# Patient Record
Sex: Female | Born: 1990 | Race: Black or African American | Hispanic: No | Marital: Single | Smoking: Never smoker
Health system: Southern US, Community
[De-identification: ages and names within clinical notes are randomized; demographics above are authoritative.]

## PROBLEM LIST (undated history)

## (undated) ENCOUNTER — Inpatient Hospital Stay (HOSPITAL_COMMUNITY): Payer: Self-pay

## (undated) DIAGNOSIS — Z789 Other specified health status: Secondary | ICD-10-CM

## (undated) HISTORY — PX: NO PAST SURGERIES: SHX2092

---

## 2012-10-08 ENCOUNTER — Inpatient Hospital Stay (HOSPITAL_COMMUNITY)
Admission: AD | Admit: 2012-10-08 | Discharge: 2012-10-08 | Disposition: A | Discharge status: Home / Self Care | Payer: BC Managed Care – PPO | Source: Ambulatory Visit | Attending: Obstetrics & Gynecology | Admitting: Obstetrics & Gynecology

## 2012-10-08 ENCOUNTER — Encounter (HOSPITAL_COMMUNITY): Payer: Self-pay | Admitting: Medical

## 2012-10-08 DIAGNOSIS — Z3201 Encounter for pregnancy test, result positive: Secondary | ICD-10-CM

## 2012-10-08 LAB — POCT PREGNANCY, URINE: Preg Test, Ur: POSITIVE — AB

## 2012-10-08 NOTE — MAU Provider Note (Signed)
Ms. Annette Welch is a 22 y.o. G1P0 at [redacted]w[redacted]d who presents to MAU today for pregnancy confirmation. The patient has had +HPT x 4. She denies abdominal pain, N/V, fever or vaginal bleeding.   BP 126/76  Pulse 80  Temp(Src) 97.9 F (36.6 C) (Oral)  Resp 16  Ht 5' 3.75" (1.619 m)  Wt 169 lb (76.658 kg)  BMI 29.25 kg/m2  LMP 09/02/2012 GENERAL: Well-developed, well-nourished female in no acute distress.  HEENT: Normocephalic, atraumatic.   LUNGS: Effort normal HEART: Regular rate  SKIN: Warm, dry and without erythema PSYCH: Normal mood and affect  Results for orders placed during the hospital encounter of 10/08/12 (from the past 24 hour(s))  POCT PREGNANCY, URINE     Status: Abnormal   Collection Time    10/08/12  1:26 PM      Result Value Range   Preg Test, Ur POSITIVE (*) NEGATIVE   A: Positive pregnancy test  P: Discharge home First trimester warning signs reviewed Patient has an appointment scheduled to start prenatal care with [facility].  Patient given pregnancy confirmation letter given Patient may return to MAU as needed  Freddi Starr, PA-C  10/08/2012 2:09 PM

## 2012-10-08 NOTE — MAU Note (Signed)
Pt states LMP-09/02/2012, denies pain or bleeding, had 4 positive home upts. Here for verification.

## 2012-11-05 ENCOUNTER — Ambulatory Visit (INDEPENDENT_AMBULATORY_CARE_PROVIDER_SITE_OTHER): Payer: Medicaid Other | Admitting: Obstetrics

## 2012-11-05 ENCOUNTER — Encounter: Payer: Self-pay | Admitting: Obstetrics

## 2012-11-05 VITALS — BP 121/75 | Temp 99.0°F | Wt 169.6 lb

## 2012-11-05 DIAGNOSIS — Z3401 Encounter for supervision of normal first pregnancy, first trimester: Secondary | ICD-10-CM

## 2012-11-05 DIAGNOSIS — Z3201 Encounter for pregnancy test, result positive: Secondary | ICD-10-CM

## 2012-11-05 LAB — OB RESULTS CONSOLE GC/CHLAMYDIA
CHLAMYDIA, DNA PROBE: NEGATIVE
Gonorrhea: NEGATIVE

## 2012-11-05 LAB — POCT URINALYSIS DIPSTICK
Blood, UA: NEGATIVE
Glucose, UA: NEGATIVE
Nitrite, UA: NEGATIVE
Urobilinogen, UA: NEGATIVE

## 2012-11-05 NOTE — Progress Notes (Signed)
Pulse- 62 . Subjective:    Annette Welch is being seen today for her first obstetrical visit.  This is not a planned pregnancy. She is at [redacted]w[redacted]d gestation. Her obstetrical history is significant for none. Relationship with FOB: significant other, living together. Patient does intend to breast feed. Pregnancy history fully reviewed.  Menstrual History: OB History   Grav Para Term Preterm Abortions TAB SAB Ect Mult Living   1               Menarche age: 22 Patient's last menstrual period was 09/02/2012.    The following portions of the patient's history were reviewed and updated as appropriate: allergies, current medications, past family history, past medical history, past social history, past surgical history and problem list.  Review of Systems Pertinent items are noted in HPI.    Objective:    General appearance: alert and no distress Abdomen: normal findings: soft, non-tender Pelvic: cervix normal in appearance, external genitalia normal, no adnexal masses or tenderness, no cervical motion tenderness, uterus normal size, shape, and consistency and vagina normal without discharge Extremities: extremities normal, atraumatic, no cyanosis or edema    Assessment:    Pregnancy at [redacted]w[redacted]d weeks    Plan:    Initial labs drawn. Prenatal vitamins.  Counseling provided regarding continued use of seat belts, cessation of alcohol consumption, smoking or use of illicit drugs; infection precautions i.e., influenza/TDAP immunizations, toxoplasmosis,CMV, parvovirus, listeria and varicella; workplace safety, exercise during pregnancy; routine dental care, safe medications, sexual activity, hot tubs, saunas, pools, travel, caffeine use, fish and methlymercury, potential toxins, hair treatments, varicose veins Weight gain recommendations reviewed: underweight/BMI< 18.5--> gain 28 - 40 lbs; normal weight/BMI 18.5 - 24.9--> gain 25 - 35 lbs; overweight/BMI 25 - 29.9--> gain 15 - 25 lbs; obese/BMI  >30->gain  11 - 20 lbs Problem list reviewed and updated. AFP3 discussed: requested. Role of ultrasound in pregnancy discussed; fetal survey: requested. Amniocentesis discussed: not indicated. Follow up in 2 weeks. 50% of 20 min visit spent on counseling and coordination of care.

## 2012-11-05 NOTE — Addendum Note (Signed)
Addended by: George Hugh on: 11/05/2012 01:46 PM   Modules accepted: Orders

## 2012-11-06 LAB — OBSTETRIC PANEL
Eosinophils Absolute: 0.1 10*3/uL (ref 0.0–0.7)
Hemoglobin: 12.6 g/dL (ref 12.0–15.0)
Hepatitis B Surface Ag: NEGATIVE
Lymphocytes Relative: 34 % (ref 12–46)
Lymphs Abs: 1.9 10*3/uL (ref 0.7–4.0)
MCH: 29.4 pg (ref 26.0–34.0)
Monocytes Relative: 10 % (ref 3–12)
Neutro Abs: 3 10*3/uL (ref 1.7–7.7)
Neutrophils Relative %: 54 % (ref 43–77)
Platelets: 271 10*3/uL (ref 150–400)
RBC: 4.29 MIL/uL (ref 3.87–5.11)
Rh Type: POSITIVE
Rubella: 3.48 Index — ABNORMAL HIGH (ref <0.90)
WBC: 5.6 10*3/uL (ref 4.0–10.5)

## 2012-11-06 LAB — WET PREP BY MOLECULAR PROBE: Candida species: NEGATIVE

## 2012-11-06 LAB — VARICELLA ZOSTER ANTIBODY, IGG: Varicella IgG: 2034 Index — ABNORMAL HIGH (ref <135.00)

## 2012-11-06 LAB — VITAMIN D 25 HYDROXY (VIT D DEFICIENCY, FRACTURES): Vit D, 25-Hydroxy: 12 ng/mL — ABNORMAL LOW (ref 30–89)

## 2012-11-07 LAB — CULTURE, OB URINE: Organism ID, Bacteria: NO GROWTH

## 2012-11-07 LAB — HEMOGLOBINOPATHY EVALUATION
Hemoglobin Other: 0 %
Hgb A: 96.8 % (ref 96.8–97.8)
Hgb S Quant: 0 %

## 2012-11-08 ENCOUNTER — Other Ambulatory Visit: Payer: Self-pay | Admitting: *Deleted

## 2012-11-08 DIAGNOSIS — B9689 Other specified bacterial agents as the cause of diseases classified elsewhere: Secondary | ICD-10-CM

## 2012-11-08 MED ORDER — METRONIDAZOLE 500 MG PO TABS: 500.0000 mg | ORAL_TABLET | Freq: Two times a day (BID) | ORAL | Status: DC

## 2012-11-20 ENCOUNTER — Other Ambulatory Visit: Payer: Self-pay | Admitting: *Deleted

## 2012-11-20 MED ORDER — FLUCONAZOLE 150 MG PO TABS: 150.0000 mg | ORAL_TABLET | Freq: Once | ORAL | Status: DC

## 2012-11-20 NOTE — Progress Notes (Signed)
Patient states that she recently finished her antibiotics and now has vaginal itching.  Diflucan 150mg  PO sent to pharmacy.  Patient advised if symptoms do not improve to call office and make an apointment.

## 2012-11-22 ENCOUNTER — Ambulatory Visit (INDEPENDENT_AMBULATORY_CARE_PROVIDER_SITE_OTHER): Payer: Medicaid Other | Admitting: Obstetrics & Gynecology

## 2012-11-22 VITALS — BP 117/74 | Temp 97.3°F | Wt 174.0 lb

## 2012-11-22 DIAGNOSIS — Z3401 Encounter for supervision of normal first pregnancy, first trimester: Secondary | ICD-10-CM

## 2012-11-22 DIAGNOSIS — Z3400 Encounter for supervision of normal first pregnancy, unspecified trimester: Secondary | ICD-10-CM | POA: Insufficient documentation

## 2012-11-22 LAB — POCT URINALYSIS DIPSTICK
Glucose, UA: NEGATIVE
Ketones, UA: NEGATIVE
Leukocytes, UA: NEGATIVE
Protein, UA: NEGATIVE
Urobilinogen, UA: NEGATIVE

## 2012-11-22 NOTE — Progress Notes (Signed)
Pt states pain and pressure early n morning before bowels.

## 2012-11-26 ENCOUNTER — Encounter: Payer: Self-pay | Admitting: Obstetrics & Gynecology

## 2012-12-20 ENCOUNTER — Ambulatory Visit (INDEPENDENT_AMBULATORY_CARE_PROVIDER_SITE_OTHER): Payer: Medicaid Other | Admitting: Obstetrics & Gynecology

## 2012-12-20 ENCOUNTER — Encounter: Payer: Self-pay | Admitting: Obstetrics & Gynecology

## 2012-12-20 VITALS — BP 115/64 | Temp 98.1°F | Wt 179.0 lb

## 2012-12-20 DIAGNOSIS — Z3402 Encounter for supervision of normal first pregnancy, second trimester: Secondary | ICD-10-CM

## 2012-12-20 DIAGNOSIS — Z3400 Encounter for supervision of normal first pregnancy, unspecified trimester: Secondary | ICD-10-CM

## 2012-12-20 LAB — POCT URINALYSIS DIPSTICK
Bilirubin, UA: NEGATIVE
Leukocytes, UA: NEGATIVE
Nitrite, UA: NEGATIVE
pH, UA: 7

## 2012-12-20 NOTE — Progress Notes (Signed)
Pulse- 75 Offered quad screen/CF mutation testing.

## 2012-12-20 NOTE — Patient Instructions (Signed)
Alpha-Fetoprotein Alpha-Fetoprotein (AFP) is a protein made by the fetal liver. It is normally elevated in the newborn and the mother. In the infant, the value falls to adult values (normally less than 20 ng/ml (nanograms per milliliter) by one year of age. This protein is found in a number of abnormal tumors and conditions. It can be used for a screening test when this is appropriate. PREPARATION FOR TEST No preparation or fasting is required. ELEVATIONS OF AFP ARE CAUSED BY:  Primary hepatocellular carcinoma (cancer of the liver) and the level correlates with tumor size.  A screening test for embryonic teratocarcinomas, hepatoblastomas (tumor of the liver).  Rarely hepatic metastases (cancer spread) from the GI tract.  Some cholangiocarcinomas cause elevations greater than 400 ng/ml.  Rapidly progressing hepatitis can produce levels of AFP in excess of 1000ng/ml.  Lesser levels of hepatitis (inflammation of the liver) can produce levels of 100 to 400 ng/ml. NORMAL FINDINGS   Adult: Less than 40 ng/mL or Less than 40 mg/L (SI units).  Child younger than 1 year: Less than 30ng/mL. Ranges are stratified by weeks of gestation and vary among laboratories. Ranges for normal findings may vary among different laboratories and hospitals. You should always check with your doctor after having lab work or other tests done to discuss the meaning of your test results and whether your values are considered within normal limits. MEANING OF TEST  Your caregiver will go over the test results with you and discuss the importance and meaning of your results, as well as treatment options and the need for additional tests if necessary. OBTAINING THE TEST RESULTS  It is your responsibility to obtain your test results. Ask the lab or department performing the test when and how you will get your results. Document Released: 03/31/2004 Document Revised: 05/30/2011 Document Reviewed: 02/10/2008 [employer] Patient  Information 2014 [employer]. CF Gene Mutation Testing This is a test used to detect cystic fibrosis (CF) genetic mutations to establish CF carrier status or to establish the diagnosis of CF in an individual. The CF gene mutation test identifies mutations in the CFTR gene on chromosome 7. Each cell in the human body (except sperm and eggs) has 46 chromosomes (23 inherited from the mother and 23 from the father). Genes on these chromosomes form the body's blueprint for producing proteins that control body functions. Cystic fibrosis is caused by a mutation in a pair of genes located on chromosomes 7. Both copies of this gene must be abnormal to cause CF. If only one copy of the gene pair is mutated, the patient will be a carrier. Carriers are not ill, they do not have any symptoms, but they can pass their abnormal CF gene copy on to their children.  When a newborn infant has meconium ileus (no stools in the first 24 to 48 hours of life) or when a person has symptoms of CF (salty sweat, persistent respiratory infections, wheezing, persistent diarrhea, foul-smelling greasy stools, malnutrition, and vitamin deficiency); if a person has a positive sweat chloride or IRT test or a close relative who has been diagnosed with CF; when a patient is undergoing genetic counseling and wants to find out if they are a CF carrier; or for prenatal diagnosis. PREPARATION FOR TEST A blood sample drawn from an infant's heel; a spot of blood that is put onto filter paper; or a blood sample is drawn from a vein in the arm. NORMAL FINDINGS No genetic mutation. Ranges for normal findings may vary among different laboratories   and hospitals. You should always check with your doctor after having lab work or other tests done to discuss the meaning of your test results and whether your values are considered within normal limits. MEANING OF TEST Your caregiver will go over the test results with you and discuss the importance and  meaning of your results, as well as treatment options and the need for additional tests if necessary. OBTAINING THE TEST RESULTS It is your responsibility to obtain your test results. Ask the lab or department performing the test when and how you will get your results. Document Released: 03/31/2004 Document Revised: 05/30/2011 Document Reviewed: 02/13/2008 [employer] Patient Information 2014 [employer].  

## 2012-12-30 ENCOUNTER — Encounter (HOSPITAL_COMMUNITY): Payer: Self-pay | Admitting: *Deleted

## 2012-12-30 ENCOUNTER — Inpatient Hospital Stay (HOSPITAL_COMMUNITY)
Admission: AD | Admit: 2012-12-30 | Discharge: 2012-12-30 | Disposition: A | Discharge status: Home / Self Care | Payer: Medicaid Other | Source: Ambulatory Visit | Attending: Obstetrics & Gynecology | Admitting: Obstetrics & Gynecology

## 2012-12-30 DIAGNOSIS — N899 Noninflammatory disorder of vagina, unspecified: Secondary | ICD-10-CM

## 2012-12-30 DIAGNOSIS — B373 Candidiasis of vulva and vagina: Secondary | ICD-10-CM

## 2012-12-30 DIAGNOSIS — B3731 Acute candidiasis of vulva and vagina: Secondary | ICD-10-CM | POA: Insufficient documentation

## 2012-12-30 DIAGNOSIS — Z3401 Encounter for supervision of normal first pregnancy, first trimester: Secondary | ICD-10-CM

## 2012-12-30 DIAGNOSIS — O2390 Unspecified genitourinary tract infection in pregnancy, unspecified trimester: Secondary | ICD-10-CM | POA: Insufficient documentation

## 2012-12-30 DIAGNOSIS — O99891 Other specified diseases and conditions complicating pregnancy: Secondary | ICD-10-CM

## 2012-12-30 DIAGNOSIS — N949 Unspecified condition associated with female genital organs and menstrual cycle: Secondary | ICD-10-CM | POA: Insufficient documentation

## 2012-12-30 HISTORY — DX: Other specified health status: Z78.9

## 2012-12-30 LAB — WET PREP, GENITAL
Clue Cells Wet Prep HPF POC: NONE SEEN
Trich, Wet Prep: NONE SEEN

## 2012-12-30 MED ORDER — TERCONAZOLE 0.8 % VA CREA: 1.0000 | TOPICAL_CREAM | Freq: Every day | VAGINAL | Status: DC

## 2012-12-30 NOTE — MAU Note (Signed)
Pt. Here for vaginal irritation and itching for one week. Also, noticed swelling as well. Does have a small amount of white discharge that has no odor. Has not been wearing a panty liner. Denies any bleeding. Has not changed any soaps or detergents recently. No new sexual partners.

## 2012-12-30 NOTE — MAU Note (Signed)
Patient states she has had a vaginal irritation with itching for about one week. Slight discharge with no color or odor.

## 2012-12-30 NOTE — MAU Provider Note (Signed)
  History     CSN: [id number]  Arrival date and time: 12/30/12 1421   First Provider Initiated Contact with Patient 12/30/12 1521      Chief Complaint  Patient presents with  . Vaginal Discharge   HPI Annette Welch is a 22 y.o. G1P0 at [redacted]w[redacted]d. She presents with c/o vaginal/vulvar irritation/ithing x 1 wk. She has increased discharge, clear to white, no odor.No bleeding or leaking. No cramping or contractions.  OB History   Grav Para Term Preterm Abortions TAB SAB Ect Mult Living   1               Past Medical History  Diagnosis Date  . Medical history non-contributory     Past Surgical History  Procedure Laterality Date  . No past surgeries      Family History  Problem Relation Age of Onset  . Heart disease Mother     History  Substance Use Topics  . Smoking status: Never Smoker   . Smokeless tobacco: Never Used  . Alcohol Use: No    Allergies:  Allergies  Allergen Reactions  . Penicillins Hives    Prescriptions prior to admission  Medication Sig Dispense Refill  . Cholecalciferol (D3-1000) 1000 UNITS capsule Take 1,000 Units by mouth daily.      . Prenatal Vit-Fe Fumarate-FA (PRENATAL MULTIVITAMIN) TABS tablet Take 1 tablet by mouth daily at 12 noon.        Review of Systems  Constitutional: Negative for fever and chills.  Gastrointestinal: Negative for nausea, vomiting, diarrhea and constipation.  Genitourinary: Negative for dysuria, urgency and frequency.   Physical Exam   Pulse 83, temperature 99.2 F (37.3 C), temperature source Oral, resp. rate 16, height 5' 3.5" (1.613 m), weight 178 lb (80.74 kg), last menstrual period 09/02/2012, SpO2 100.00%.  Physical Exam  Constitutional: She is oriented to person, place, and time. She appears well-developed and well-nourished.  GI: Soft. She exhibits no distension. There is no tenderness.  +FHT's  Genitourinary:  Pelvic exam: Ext gen- erythema, 2 linear abrasions posterior introitus Vagina- mod  amt thick white smooth discharge Cx- closed Ut- gravid Adn-non tender  Musculoskeletal: Normal range of motion.  Neurological: She is alert and oriented to person, place, and time.  Skin: Skin is warm and dry.  Psychiatric: She has a normal mood and affect. Her behavior is normal.    MAU Course  Procedures  MDM Results for orders placed during the hospital encounter of 12/30/12 (from the past 24 hour(s))  WET PREP, GENITAL     Status: Abnormal   Collection Time    12/30/12  3:20 PM      Result Value Range   Yeast Wet Prep HPF POC FEW (*) NONE SEEN   Trich, Wet Prep NONE SEEN  NONE SEEN   Clue Cells Wet Prep HPF POC NONE SEEN  NONE SEEN   WBC, Wet Prep HPF POC MODERATE (*) NONE SEEN     Assessment and Plan  ASSESSMENT:   [redacted] wks pregnant Vaginal yeast  PLAN:  Terazol Cream X 3 hs  Warm soaks prn Keep next pnv  Saul Fabiano M. 12/30/2012, 3:29 PM

## 2013-01-08 ENCOUNTER — Other Ambulatory Visit: Payer: Self-pay | Admitting: Obstetrics & Gynecology

## 2013-01-08 ENCOUNTER — Encounter: Payer: Self-pay | Admitting: Obstetrics & Gynecology

## 2013-01-08 ENCOUNTER — Ambulatory Visit (INDEPENDENT_AMBULATORY_CARE_PROVIDER_SITE_OTHER): Payer: Medicaid Other

## 2013-01-08 DIAGNOSIS — Z1389 Encounter for screening for other disorder: Secondary | ICD-10-CM

## 2013-01-08 DIAGNOSIS — O09899 Supervision of other high risk pregnancies, unspecified trimester: Secondary | ICD-10-CM

## 2013-01-08 LAB — US OB DETAIL + 14 WK

## 2013-01-17 ENCOUNTER — Ambulatory Visit (INDEPENDENT_AMBULATORY_CARE_PROVIDER_SITE_OTHER): Payer: Medicaid Other | Admitting: Obstetrics & Gynecology

## 2013-01-17 ENCOUNTER — Encounter: Payer: Self-pay | Admitting: Obstetrics & Gynecology

## 2013-01-17 VITALS — BP 113/74 | Temp 98.2°F | Wt 184.2 lb

## 2013-01-17 DIAGNOSIS — Z3400 Encounter for supervision of normal first pregnancy, unspecified trimester: Secondary | ICD-10-CM

## 2013-01-17 DIAGNOSIS — Z3402 Encounter for supervision of normal first pregnancy, second trimester: Secondary | ICD-10-CM

## 2013-01-17 LAB — POCT URINALYSIS DIPSTICK: pH, UA: 8.5

## 2013-01-17 NOTE — Patient Instructions (Signed)
Pregnancy - Second Trimester The second trimester is the period between 13 to 27 weeks of your pregnancy. It is important to follow your doctor's instructions. HOME CARE   Do not smoke.  Do not drink alcohol or use drugs.  Only take medicine as told by your doctor.  Take prenatal vitamins as told. The vitamin should contain 1 milligram of folic acid.  Exercise.  Eat healthy foods. Eat regular, well-balanced meals.  You can have sex (intercourse) if there are no other problems with the pregnancy.  Do not use hot tubs, steam rooms, or saunas.  Wear a seat belt while driving.  Avoid raw meat, uncooked cheese, and litter boxes and soil used by cats.  Visit your dentist. Cleanings are okay. GET HELP RIGHT AWAY IF:   You have a temperature by mouth above 102 F (38.9 C), not controlled by medicine.  Fluid is coming from your vagina.  Blood is coming from your vagina. Light spotting is common, especially after sex (intercourse).  You have a bad smelling fluid (discharge) coming from the vagina. The fluid changes from clear to white.  You still feel sick to your stomach (nauseous).  You throw up (vomit) blood.  You lose or gain more than 2 pounds (0.9 kilograms) of weight in a week, or as suggested by your doctor.  Your face, hands, feet, or legs get puffy (swell).  You get exposed to German measles and have never had them.  You get exposed to fifth disease or chickenpox.  You have belly (abdominal) pain.  You have a bad headache that will not go away.  You have watery poop (diarrhea), pain when you pee (urinate), or have shortness of breath.  You start to have problems seeing (blurry or double vision).  You fall, are in a car accident, or have any kind of trauma.  There is mental or physical violence at home.  You have any concerns or worries during your pregnancy. MAKE SURE YOU:   Understand these instructions.  Will watch your condition.  Will get help  right away if you are not doing well or get worse. Document Released: 06/01/2009 Document Revised: 05/30/2011 Document Reviewed: 06/01/2009 [employer] Patient Information 2014 [employer].  

## 2013-01-17 NOTE — Progress Notes (Signed)
HR - 66 Routine OB visit, pt reports low back pain.

## 2013-02-11 ENCOUNTER — Encounter: Payer: Self-pay | Admitting: Obstetrics & Gynecology

## 2013-02-11 ENCOUNTER — Encounter: Payer: Medicaid Other | Admitting: Obstetrics & Gynecology

## 2013-02-11 ENCOUNTER — Ambulatory Visit (INDEPENDENT_AMBULATORY_CARE_PROVIDER_SITE_OTHER): Payer: Medicaid Other | Admitting: Obstetrics & Gynecology

## 2013-02-11 VITALS — BP 125/66 | Temp 98.0°F | Wt 194.0 lb

## 2013-02-11 DIAGNOSIS — Z3400 Encounter for supervision of normal first pregnancy, unspecified trimester: Secondary | ICD-10-CM

## 2013-02-11 DIAGNOSIS — Z3402 Encounter for supervision of normal first pregnancy, second trimester: Secondary | ICD-10-CM

## 2013-02-11 LAB — POCT URINALYSIS DIPSTICK
Bilirubin, UA: NEGATIVE
Blood, UA: NEGATIVE
Nitrite, UA: NEGATIVE
Protein, UA: NEGATIVE
Urobilinogen, UA: NEGATIVE
pH, UA: 7

## 2013-02-11 NOTE — Patient Instructions (Signed)
Glucose Tolerance Test This is a test to see how your body processes carbohydrates. This test is often done to check patients for diabetes or the possibility of developing it. PREPARATION FOR TEST You should have nothing to eat or drink 12 hours before the test. You will be given a form of sugar (glucose) and then blood samples will be drawn from your vein to determine the level of sugar in your blood. Alternatively, blood may be drawn from your finger for testing. You should not smoke or exercise during the test. NORMAL FINDINGS  Fasting: 70-115 mg/dL  30 minutes: less than 200 mg/dL  1 hour: less than 200 mg/dL  2 hours: less than 140 mg/dL  3 hours: 70-115 mg/dL  4 hours: 70-115 mg/dL Ranges for normal findings may vary among different laboratories and hospitals. You should always check with your doctor after having lab work or other tests done to discuss the meaning of your test results and whether your values are considered within normal limits. MEANING OF TEST Your caregiver will go over the test results with you and discuss the importance and meaning of your results, as well as treatment options and the need for additional tests. OBTAINING THE TEST RESULTS It is your responsibility to obtain your test results. Ask the lab or department performing the test when and how you will get your results. Document Released: 03/30/2004 Document Revised: 05/30/2011 Document Reviewed: 02/16/2008 [employer] Patient Information 2014 [employer].  

## 2013-02-11 NOTE — Progress Notes (Signed)
Pulse- 84 Doing well.

## 2013-03-11 ENCOUNTER — Other Ambulatory Visit: Payer: Medicaid Other

## 2013-03-11 ENCOUNTER — Encounter: Payer: Medicaid Other | Admitting: Obstetrics & Gynecology

## 2013-03-11 ENCOUNTER — Ambulatory Visit (INDEPENDENT_AMBULATORY_CARE_PROVIDER_SITE_OTHER): Payer: Medicaid Other | Admitting: Advanced Practice Midwife

## 2013-03-11 VITALS — BP 107/68 | Temp 97.0°F | Wt 204.0 lb

## 2013-03-11 DIAGNOSIS — Z3402 Encounter for supervision of normal first pregnancy, second trimester: Secondary | ICD-10-CM

## 2013-03-11 DIAGNOSIS — Z3400 Encounter for supervision of normal first pregnancy, unspecified trimester: Secondary | ICD-10-CM

## 2013-03-11 LAB — CBC
HCT: 34 % — ABNORMAL LOW (ref 36.0–46.0)
Hemoglobin: 11.8 g/dL — ABNORMAL LOW (ref 12.0–15.0)
MCHC: 34.7 g/dL (ref 30.0–36.0)

## 2013-03-11 LAB — POCT URINALYSIS DIPSTICK
Bilirubin, UA: NEGATIVE
Blood, UA: NEGATIVE
Glucose, UA: NEGATIVE
Leukocytes, UA: NEGATIVE
Nitrite, UA: NEGATIVE

## 2013-03-11 NOTE — Progress Notes (Signed)
Subjective: Annette Welch is a 22 y.o. at 27 weeks by LMP  Patient denies vaginal leaking of fluid or bleeding, denies contractions.  Reports positive fetal movment.  Reports anxiety about delivery and BRF. Eating 3 large meals and 3 snacks daily.  Objective: Filed Vitals:   03/11/13 1017  BP: 107/68  Temp: 97 F (36.1 C)   140 FHR 28 Fundal Height Fetal Position unknown  Assessment: Patient Active Problem List   Diagnosis Date Noted  . Supervision of normal first pregnancy 11/22/2012    Plan: Patient to return to clinic in 2 weeks Reviewed BRF and plans for delivery. Reviewed warning signs in pregnancy. Patient to call with concerns PRN. Reviewed triage location. Reviewed diet in pregnancy.  20 min spent with patient greater than 80% spent in counseling and coordination of care.   Moon Budde Wilson Singer CNM

## 2013-03-11 NOTE — Progress Notes (Signed)
Pulse: 90

## 2013-03-12 LAB — RPR

## 2013-03-12 LAB — GLUCOSE TOLERANCE, 2 HOURS W/ 1HR: Glucose, 2 hour: 66 mg/dL — ABNORMAL LOW (ref 70–139)

## 2013-03-26 ENCOUNTER — Encounter: Payer: Self-pay | Admitting: Advanced Practice Midwife

## 2013-03-26 ENCOUNTER — Ambulatory Visit (INDEPENDENT_AMBULATORY_CARE_PROVIDER_SITE_OTHER): Payer: Medicaid Other | Admitting: Advanced Practice Midwife

## 2013-03-26 VITALS — BP 131/83 | Temp 98.0°F | Wt 209.0 lb

## 2013-03-26 DIAGNOSIS — Z3400 Encounter for supervision of normal first pregnancy, unspecified trimester: Secondary | ICD-10-CM

## 2013-03-26 DIAGNOSIS — Z3403 Encounter for supervision of normal first pregnancy, third trimester: Secondary | ICD-10-CM

## 2013-03-26 LAB — POCT URINALYSIS DIPSTICK
KETONES UA: NEGATIVE
Nitrite, UA: NEGATIVE
PH UA: 7
RBC UA: NEGATIVE
Spec Grav, UA: 1.015
Urobilinogen, UA: NEGATIVE

## 2013-03-26 NOTE — Progress Notes (Signed)
Subjective: Annette Welch is a 23 y.o. at 29 weeks by LMP  Patient denies vaginal leaking of fluid or bleeding, denies contractions.  Reports positive fetal movment.  Patient c/o bilateral hand and arm numbness and weakness, tingling sensation. Desires wrist splints. Denies repetitive motion.  Objective: Filed Vitals:   03/26/13 0958  BP: 131/83  Temp: 98 F (36.7 C)   140 FHR 32 Fundal Height Fetal Position unknown  Assessment: Patient Active Problem List   Diagnosis Date Noted  . Supervision of normal first pregnancy 11/22/2012    Plan: Patient to return to clinic in 2 weeks Plan GBS w/ sensitivity @ 36 weeks Wrist splint RX today for presumed carpel tunnel of pregnancy Reviewed warning signs in pregnancy. Patient to call with concerns PRN. Reviewed triage location.  20 min spent with patient greater than 80% spent in counseling and coordination of care.   Jahmari Esbenshade Wilson SingerWren CNM

## 2013-03-26 NOTE — Progress Notes (Signed)
Pulse: 92

## 2013-03-28 ENCOUNTER — Encounter: Payer: Self-pay | Admitting: Obstetrics & Gynecology

## 2013-04-09 ENCOUNTER — Ambulatory Visit (INDEPENDENT_AMBULATORY_CARE_PROVIDER_SITE_OTHER): Payer: Medicaid Other | Admitting: Advanced Practice Midwife

## 2013-04-09 ENCOUNTER — Encounter: Payer: Self-pay | Admitting: Advanced Practice Midwife

## 2013-04-09 VITALS — BP 128/75 | Temp 98.3°F | Wt 214.0 lb

## 2013-04-09 DIAGNOSIS — Z3400 Encounter for supervision of normal first pregnancy, unspecified trimester: Secondary | ICD-10-CM

## 2013-04-09 LAB — POCT URINALYSIS DIPSTICK
BILIRUBIN UA: NEGATIVE
GLUCOSE UA: NEGATIVE
KETONES UA: NEGATIVE
Leukocytes, UA: NEGATIVE
Nitrite, UA: NEGATIVE
PH UA: 7
RBC UA: NEGATIVE
SPEC GRAV UA: 1.015
Urobilinogen, UA: NEGATIVE

## 2013-04-09 NOTE — Progress Notes (Signed)
Subjective: Annette Welch is a 23 y.o. at 31 weeks by LMP  Patient denies vaginal leaking of fluid or bleeding, denies contractions.  Reports positive fetal movment.  Denies concerns today. [facility]   Objective: Filed Vitals:   04/09/13 1029  BP: 128/75  Temp: 98.3 F (36.8 C)   140 FHR 32 Fundal Height Fetal Position uncertain  Assessment: Patient Active Problem List   Diagnosis Date Noted  . Supervision of normal first pregnancy 11/22/2012    Plan: Patient to return to clinic in 2 weeks Discuss Pediatrician, Tdap and BCM [place]. Reviewed warning signs in pregnancy. Patient to call with concerns PRN. Reviewed triage location.  Tramane Gorum Wilson SingerWren CNM

## 2013-04-09 NOTE — Progress Notes (Signed)
Pulse 106 Pt has no concerns today.

## 2013-04-23 ENCOUNTER — Ambulatory Visit (INDEPENDENT_AMBULATORY_CARE_PROVIDER_SITE_OTHER): Payer: Medicaid Other | Admitting: Obstetrics

## 2013-04-23 ENCOUNTER — Encounter: Payer: Self-pay | Admitting: Obstetrics

## 2013-04-23 VITALS — BP 120/75 | Temp 98.5°F | Wt 222.0 lb

## 2013-04-23 DIAGNOSIS — Z3400 Encounter for supervision of normal first pregnancy, unspecified trimester: Secondary | ICD-10-CM

## 2013-04-23 LAB — POCT URINALYSIS DIPSTICK
BILIRUBIN UA: NEGATIVE
GLUCOSE UA: NEGATIVE
KETONES UA: NEGATIVE
Leukocytes, UA: NEGATIVE
Nitrite, UA: NEGATIVE
Protein, UA: NEGATIVE
SPEC GRAV UA: 1.01
UROBILINOGEN UA: NEGATIVE
pH, UA: 6

## 2013-04-23 NOTE — Progress Notes (Signed)
Pulse: 97  Patient states she is having some pelvic pressure for two days. Patient denies any concerns.

## 2013-05-07 ENCOUNTER — Encounter: Payer: Medicaid Other | Admitting: Advanced Practice Midwife

## 2013-05-10 ENCOUNTER — Ambulatory Visit (INDEPENDENT_AMBULATORY_CARE_PROVIDER_SITE_OTHER): Payer: Medicaid Other | Admitting: Advanced Practice Midwife

## 2013-05-10 VITALS — BP 136/76 | Temp 97.9°F | Wt 227.0 lb

## 2013-05-10 DIAGNOSIS — Z3400 Encounter for supervision of normal first pregnancy, unspecified trimester: Secondary | ICD-10-CM

## 2013-05-10 LAB — POCT URINALYSIS DIPSTICK
Bilirubin, UA: NEGATIVE
Blood, UA: NEGATIVE
GLUCOSE UA: NEGATIVE
KETONES UA: NEGATIVE
LEUKOCYTES UA: NEGATIVE
Nitrite, UA: NEGATIVE
PROTEIN UA: NEGATIVE
SPEC GRAV UA: 1.01
Urobilinogen, UA: NEGATIVE
pH, UA: 6

## 2013-05-10 LAB — OB RESULTS CONSOLE GBS: GBS: NEGATIVE

## 2013-05-10 NOTE — Progress Notes (Signed)
Pulse 98 Pt states that she is having increase in pelvic pressure.  Pt states that she is having irregular contractions.  Pt states that she has also passed her mucous plug with in the past week.  Pt states that she is having some discomfort with intercourse.

## 2013-05-10 NOTE — Progress Notes (Signed)
Subjective: Annette Welch is a 23 y.o. at 35 weeks by LMP  Patient denies vaginal leaking of fluid or bleeding, denies contractions.  Reports positive fetal movment.  Reviewed common discomforts of pregnancy and warning signs. Encouraged patient to find a pediatrician.  Objective: Filed Vitals:   05/10/13 0927  BP: 136/76  Temp: 97.9 F (36.6 C)   140 FHR 32 Fundal Height Fetal Position cephalic FT/Thick/High posterior  Assessment: Patient Active Problem List   Diagnosis Date Noted  . Supervision of normal first pregnancy 11/22/2012    Plan: Patient to return to clinic in 1 weeks Gave pediatrician handout.  Discussed and encouraged Tdap w/ patient and her parnter.  GBS today w/ sensitivity. Reviewed warning signs in pregnancy. Patient to call with concerns PRN. Reviewed triage location.  15 min spent with patient greater than 80% spent in counseling and coordination of care.   Dotsie Gillette Wilson SingerWren CNM

## 2013-05-13 LAB — CULTURE, STREPTOCOCCUS GRP B W/SUSCEPT

## 2013-05-14 ENCOUNTER — Encounter: Payer: Medicaid Other | Admitting: Advanced Practice Midwife

## 2013-05-21 ENCOUNTER — Ambulatory Visit (INDEPENDENT_AMBULATORY_CARE_PROVIDER_SITE_OTHER): Payer: Medicaid Other | Admitting: Advanced Practice Midwife

## 2013-05-21 VITALS — BP 138/81 | Temp 98.2°F | Wt 234.0 lb

## 2013-05-21 DIAGNOSIS — Z3400 Encounter for supervision of normal first pregnancy, unspecified trimester: Secondary | ICD-10-CM

## 2013-05-21 LAB — POCT URINALYSIS DIPSTICK
Bilirubin, UA: NEGATIVE
GLUCOSE UA: NEGATIVE
Ketones, UA: NEGATIVE
LEUKOCYTES UA: NEGATIVE
NITRITE UA: NEGATIVE
PH UA: 6
RBC UA: NEGATIVE
Spec Grav, UA: 1.02
UROBILINOGEN UA: NEGATIVE

## 2013-05-21 NOTE — Progress Notes (Signed)
Subjective: Rosaline Sharol HarnessSimmons is a 23 y.o. at 37 weeks by LMP  Patient denies vaginal leaking of fluid or bleeding, denies contractions.  Reports positive fetal movment.  Feeling more pressure. Ready for delivery. They have chosen a pediatrician.   Objective: Filed Vitals:   05/21/13 0921  BP: 138/81  Temp: 98.2 F (36.8 C)   140 FHR 37 Fundal Height Fetal Position cephalic, confirmed by BS US  Assessment: Patient Active Problem List   Diagnosis Date Noted  . Supervision of normal first pregnancy 11/22/2012    Plan: Patient to return to clinic in 1 weeks Reviewed signs of active labor Plans [facility] Has Pediatrician chosen. Reviewed warning signs in pregnancy. Patient to call with concerns PRN. Reviewed triage location.  15 min spent with patient greater than 80% spent in counseling and coordination of care.   Daryan Cagley Wilson SingerWren CNM

## 2013-05-21 NOTE — Progress Notes (Signed)
Pulse: 91  Patient states she is having lower abdominal and pelvic pain and pressure. Patient states she is having pain in her rectum. Patient denies any concerns.

## 2013-05-29 ENCOUNTER — Ambulatory Visit (INDEPENDENT_AMBULATORY_CARE_PROVIDER_SITE_OTHER): Payer: Medicaid Other | Admitting: Obstetrics & Gynecology

## 2013-05-29 VITALS — BP 131/86 | Temp 97.9°F | Wt 238.0 lb

## 2013-05-29 DIAGNOSIS — Z3400 Encounter for supervision of normal first pregnancy, unspecified trimester: Secondary | ICD-10-CM

## 2013-05-29 LAB — POCT URINALYSIS DIPSTICK
Bilirubin, UA: NEGATIVE
Glucose, UA: NEGATIVE
Leukocytes, UA: NEGATIVE
NITRITE UA: NEGATIVE
PROTEIN UA: NEGATIVE
RBC UA: NEGATIVE
SPEC GRAV UA: 1.01
UROBILINOGEN UA: NEGATIVE
pH, UA: 7

## 2013-05-29 NOTE — Progress Notes (Signed)
Pulse: 90 Patient states she is having lower abdominal and pelvic pressure. Patient states she has contractions everyday.

## 2013-06-04 ENCOUNTER — Ambulatory Visit (INDEPENDENT_AMBULATORY_CARE_PROVIDER_SITE_OTHER): Payer: Medicaid Other | Admitting: Obstetrics

## 2013-06-04 ENCOUNTER — Encounter: Payer: Self-pay | Admitting: Obstetrics

## 2013-06-04 ENCOUNTER — Encounter: Payer: Medicaid Other | Admitting: Advanced Practice Midwife

## 2013-06-04 VITALS — BP 135/81 | Temp 97.4°F | Wt 235.0 lb

## 2013-06-04 DIAGNOSIS — Z3400 Encounter for supervision of normal first pregnancy, unspecified trimester: Secondary | ICD-10-CM

## 2013-06-04 LAB — POCT URINALYSIS DIPSTICK
Bilirubin, UA: NEGATIVE
Blood, UA: NEGATIVE
Glucose, UA: NEGATIVE
KETONES UA: NEGATIVE
Leukocytes, UA: NEGATIVE
Nitrite, UA: NEGATIVE
PH UA: 6.5
SPEC GRAV UA: 1.015
Urobilinogen, UA: NEGATIVE

## 2013-06-04 NOTE — Progress Notes (Signed)
Pulse: 84

## 2013-06-09 ENCOUNTER — Inpatient Hospital Stay (HOSPITAL_COMMUNITY)
Admission: AD | Admit: 2013-06-09 | Discharge: 2013-06-09 | Disposition: A | Discharge status: Home / Self Care | Payer: Medicaid Other | Source: Ambulatory Visit | Attending: Obstetrics & Gynecology | Admitting: Obstetrics & Gynecology

## 2013-06-09 DIAGNOSIS — O9989 Other specified diseases and conditions complicating pregnancy, childbirth and the puerperium: Principal | ICD-10-CM

## 2013-06-09 DIAGNOSIS — O479 False labor, unspecified: Secondary | ICD-10-CM | POA: Insufficient documentation

## 2013-06-09 DIAGNOSIS — R109 Unspecified abdominal pain: Secondary | ICD-10-CM | POA: Insufficient documentation

## 2013-06-09 DIAGNOSIS — O99891 Other specified diseases and conditions complicating pregnancy: Secondary | ICD-10-CM | POA: Insufficient documentation

## 2013-06-09 LAB — POCT FERN TEST: POCT Fern Test: NEGATIVE

## 2013-06-09 NOTE — MAU Note (Signed)
I woke up about 0120 in a puddle of clear fld. Some cramping. I have on a pad now so unsure if i'm continuing to leak

## 2013-06-09 NOTE — MAU Provider Note (Signed)
Ms. Annette Welch is a 23 y.o. G1P0 at 7423w0d who presents to MAU today with complaint of possible ROM. The patient states that she woke up today "in a puddle." she denies continues LOF. She denies vaginal discharge or bleeding. She states occasional mild contractions. She reports good fetal movement.   BP 125/75  Pulse 90  Temp(Src) 98.1 F (36.7 C)  Resp 20  Ht 5\' 4"  (1.626 m)  Wt 240 lb 6.4 oz (109.045 kg)  BMI 41.24 kg/m2  LMP 09/02/2012 GENERAL: Well-developed, well-nourished female in no acute distress.  HEENT: Normocephalic, atraumatic LUNGS: Normal effort HEART: Regular rate  PELVIC: Normal external female genitalia. Vagina is pink and rugated.  Moderate amount of thin, white discharge. Negative pooling. Normal cervix contour. Uterus gravid. Appropriate for GA.  EXTREMITIES: No cyanosis, clubbing, or edema  Results for orders placed during the hospital encounter of 06/09/13 (from the past 24 hour(s))  POCT FERN TEST     Status: None   Collection Time    06/09/13  2:51 AM      Result Value Ref Range   POCT Fern Test Negative = intact amniotic membranes     Fetal Monitoring: Baseline: 130 bpm, moderate variability, + accelerations, no decelerations Contractions: occasional, irregular  A: Membranes intact No active labor  P: RN reported to MD for [facility]. Ok for discharge Labor precautions given on AVS Patient to follow-up as scheduled with [facility] for routine prenatal care Patient may return to MAU as needed or if her condition were to change or worsen  Freddi StarrJulie N Ethier, PA-C 06/09/2013 3:09 AM

## 2013-06-09 NOTE — Progress Notes (Signed)
Written and verbal d/c instructions given and understanding voiced. 

## 2013-06-09 NOTE — Discharge Instructions (Signed)
Fetal Movement Counts °Patient Name: __________________________________________________ Patient Due Date: ____________________ °Performing a fetal movement count is highly recommended in high-risk pregnancies, but it is good for every pregnant woman to do. Your caregiver may ask you to start counting fetal movements at 28 weeks of the pregnancy. Fetal movements often increase: °· After eating a full meal. °· After physical activity. °· After eating or drinking something sweet or cold. °· At rest. °Pay attention to when you feel the baby is most active. This will help you notice a pattern of your baby's sleep and wake cycles and what factors contribute to an increase in fetal movement. It is important to perform a fetal movement count at the same time each day when your baby is normally most active.  °HOW TO COUNT FETAL MOVEMENTS °1. Find a quiet and comfortable area to sit or lie down on your left side. Lying on your left side provides the best blood and oxygen circulation to your baby. °2. Write down the day and time on a sheet of paper or in a journal. °3. Start counting kicks, flutters, swishes, rolls, or jabs in a 2 hour period. You should feel at least 10 movements within 2 hours. °4. If you do not feel 10 movements in 2 hours, wait 2 3 hours and count again. Look for a change in the pattern or not enough counts in 2 hours. °SEEK MEDICAL CARE IF: °· You feel less than 10 counts in 2 hours, tried twice. °· There is no movement in over an hour. °· The pattern is changing or taking longer each day to reach 10 counts in 2 hours. °· You feel the baby is not moving as he or she usually does. °Date: ____________ Movements: ____________ Start time: ____________ Finish time: ____________  °Date: ____________ Movements: ____________ Start time: ____________ Finish time: ____________ °Date: ____________ Movements: ____________ Start time: ____________ Finish time: ____________ °Date: ____________ Movements: ____________  Start time: ____________ Finish time: ____________ °Date: ____________ Movements: ____________ Start time: ____________ Finish time: ____________ °Date: ____________ Movements: ____________ Start time: ____________ Finish time: ____________ °Date: ____________ Movements: ____________ Start time: ____________ Finish time: ____________ °Date: ____________ Movements: ____________ Start time: ____________ Finish time: ____________  °Date: ____________ Movements: ____________ Start time: ____________ Finish time: ____________ °Date: ____________ Movements: ____________ Start time: ____________ Finish time: ____________ °Date: ____________ Movements: ____________ Start time: ____________ Finish time: ____________ °Date: ____________ Movements: ____________ Start time: ____________ Finish time: ____________ °Date: ____________ Movements: ____________ Start time: ____________ Finish time: ____________ °Date: ____________ Movements: ____________ Start time: ____________ Finish time: ____________ °Date: ____________ Movements: ____________ Start time: ____________ Finish time: ____________  °Date: ____________ Movements: ____________ Start time: ____________ Finish time: ____________ °Date: ____________ Movements: ____________ Start time: ____________ Finish time: ____________ °Date: ____________ Movements: ____________ Start time: ____________ Finish time: ____________ °Date: ____________ Movements: ____________ Start time: ____________ Finish time: ____________ °Date: ____________ Movements: ____________ Start time: ____________ Finish time: ____________ °Date: ____________ Movements: ____________ Start time: ____________ Finish time: ____________ °Date: ____________ Movements: ____________ Start time: ____________ Finish time: ____________  °Date: ____________ Movements: ____________ Start time: ____________ Finish time: ____________ °Date: ____________ Movements: ____________ Start time: ____________ Finish time:  ____________ °Date: ____________ Movements: ____________ Start time: ____________ Finish time: ____________ °Date: ____________ Movements: ____________ Start time: ____________ Finish time: ____________ °Date: ____________ Movements: ____________ Start time: ____________ Finish time: ____________ °Date: ____________ Movements: ____________ Start time: ____________ Finish time: ____________ °Date: ____________ Movements: ____________ Start time: ____________ Finish time: ____________  °Date: ____________ Movements: ____________ Start time: ____________ Finish   time: ____________ °Date: ____________ Movements: ____________ Start time: ____________ Finish time: ____________ °Date: ____________ Movements: ____________ Start time: ____________ Finish time: ____________ °Date: ____________ Movements: ____________ Start time: ____________ Finish time: ____________ °Date: ____________ Movements: ____________ Start time: ____________ Finish time: ____________ °Date: ____________ Movements: ____________ Start time: ____________ Finish time: ____________ °Date: ____________ Movements: ____________ Start time: ____________ Finish time: ____________  °Date: ____________ Movements: ____________ Start time: ____________ Finish time: ____________ °Date: ____________ Movements: ____________ Start time: ____________ Finish time: ____________ °Date: ____________ Movements: ____________ Start time: ____________ Finish time: ____________ °Date: ____________ Movements: ____________ Start time: ____________ Finish time: ____________ °Date: ____________ Movements: ____________ Start time: ____________ Finish time: ____________ °Date: ____________ Movements: ____________ Start time: ____________ Finish time: ____________ °Date: ____________ Movements: ____________ Start time: ____________ Finish time: ____________  °Date: ____________ Movements: ____________ Start time: ____________ Finish time: ____________ °Date: ____________ Movements:  ____________ Start time: ____________ Finish time: ____________ °Date: ____________ Movements: ____________ Start time: ____________ Finish time: ____________ °Date: ____________ Movements: ____________ Start time: ____________ Finish time: ____________ °Date: ____________ Movements: ____________ Start time: ____________ Finish time: ____________ °Date: ____________ Movements: ____________ Start time: ____________ Finish time: ____________ °Date: ____________ Movements: ____________ Start time: ____________ Finish time: ____________  °Date: ____________ Movements: ____________ Start time: ____________ Finish time: ____________ °Date: ____________ Movements: ____________ Start time: ____________ Finish time: ____________ °Date: ____________ Movements: ____________ Start time: ____________ Finish time: ____________ °Date: ____________ Movements: ____________ Start time: ____________ Finish time: ____________ °Date: ____________ Movements: ____________ Start time: ____________ Finish time: ____________ °Date: ____________ Movements: ____________ Start time: ____________ Finish time: ____________ °Document Released: 04/06/2006 Document Revised: 02/22/2012 Document Reviewed: 01/02/2012 °[employer]® Patient Information ©2014 [employer]. ° ° °Braxton Hicks Contractions °Pregnancy is commonly associated with contractions of the uterus throughout the pregnancy. Towards the end of pregnancy (32 to 34 weeks), these contractions (Braxton Hicks) can develop more often and may become more forceful. This is not true labor because these contractions do not result in opening (dilatation) and thinning of the cervix. They are sometimes difficult to tell apart from true labor because these contractions can be forceful and people have different pain tolerances. You should not feel embarrassed if you go to the hospital with false labor. Sometimes, the only way to tell if you are in true labor is for your caregiver to follow the changes  in the cervix. °How to tell the difference between true and false labor: °· False labor. °· The contractions of false labor are usually shorter, irregular and not as hard as those of true labor. °· They are often felt in the front of the lower abdomen and in the groin. °· They may leave with walking around or changing positions while lying down. °· They get weaker and are shorter lasting as time goes on. °· These contractions are usually irregular. °· They do not usually become progressively stronger, regular and closer together as with true labor. °· True labor. °· Contractions in true labor last 30 to 70 seconds, become very regular, usually become more intense, and increase in frequency. °· They do not go away with walking. °· The discomfort is usually felt in the top of the uterus and spreads to the lower abdomen and low back. °· True labor can be determined by your caregiver with an exam. This will show that the cervix is dilating and getting thinner. °If there are no prenatal problems or other health problems associated with the pregnancy, it is completely safe to be sent home with false labor and await the onset of   true labor. °HOME CARE INSTRUCTIONS  °· Keep up with your usual exercises and instructions. °· Take medications as directed. °· Keep your regular prenatal appointment. °· Eat and drink lightly if you think you are going into labor. °· If BH contractions are making you uncomfortable: °· Change your activity position from lying down or resting to walking/walking to resting. °· Sit and rest in a tub of warm water. °· Drink 2 to 3 glasses of water. Dehydration may cause B-H contractions. °· Do slow and deep breathing several times an hour. °SEEK IMMEDIATE MEDICAL CARE IF:  °· Your contractions continue to become stronger, more regular, and closer together. °· You have a gushing, burst or leaking of fluid from the vagina. °· An oral temperature above 102° F (38.9° C) develops. °· You have passage of  blood-tinged mucus. °· You develop vaginal bleeding. °· You develop continuous belly (abdominal) pain. °· You have low back pain that you never had before. °· You feel the baby's head pushing down causing pelvic pressure. °· The baby is not moving as much as it used to. °Document Released: 03/07/2005 Document Revised: 05/30/2011 Document Reviewed: 12/17/2012 °[employer]® Patient Information ©2014 [employer]. ° ° °

## 2013-06-11 ENCOUNTER — Inpatient Hospital Stay (HOSPITAL_COMMUNITY)
Admission: AD | Admit: 2013-06-11 | Discharge: 2013-06-12 | Disposition: A | Discharge status: Home / Self Care | Payer: Medicaid Other | Source: Ambulatory Visit | Attending: Obstetrics | Admitting: Obstetrics

## 2013-06-11 ENCOUNTER — Ambulatory Visit (INDEPENDENT_AMBULATORY_CARE_PROVIDER_SITE_OTHER): Payer: Medicaid Other | Admitting: Advanced Practice Midwife

## 2013-06-11 ENCOUNTER — Encounter (HOSPITAL_COMMUNITY): Payer: Self-pay | Admitting: *Deleted

## 2013-06-11 ENCOUNTER — Other Ambulatory Visit: Payer: Self-pay | Admitting: *Deleted

## 2013-06-11 VITALS — BP 136/84 | Temp 98.0°F | Wt 241.0 lb

## 2013-06-11 DIAGNOSIS — Z3400 Encounter for supervision of normal first pregnancy, unspecified trimester: Secondary | ICD-10-CM

## 2013-06-11 DIAGNOSIS — O479 False labor, unspecified: Secondary | ICD-10-CM | POA: Insufficient documentation

## 2013-06-11 DIAGNOSIS — O480 Post-term pregnancy: Secondary | ICD-10-CM

## 2013-06-11 LAB — POCT URINALYSIS DIPSTICK
Bilirubin, UA: NEGATIVE
Blood, UA: NEGATIVE
Glucose, UA: NEGATIVE
Ketones, UA: NEGATIVE
Leukocytes, UA: NEGATIVE
Nitrite, UA: NEGATIVE
Spec Grav, UA: 1.015
UROBILINOGEN UA: NEGATIVE
pH, UA: 6.5

## 2013-06-11 NOTE — Progress Notes (Signed)
Subjective: Annette Welch is a 23 y.o. at 40 weeks by LMP  Patient denies vaginal leaking of fluid or bleeding, denies contractions.  Reports positive fetal movment.  Denies concerns today. Ready for delivery. Desires IOL @ 41 weeks.  Objective: Filed Vitals:   06/11/13 0913  BP: 136/84  Temp: 98 F (36.7 C)   140 FHR 40 Fundal Height Fetal Position cephalic SVE 1.5/30/-3 NST 140, + accel, negative decel, mod variability. Toco Negative  Assessment: Patient Active Problem List   Diagnosis Date Noted  . Supervision of normal first pregnancy 11/22/2012  Category 1, Reactive and Reassuring NST  Plan: Patient to return to clinic in Friday. Plan IOL next week, Monday Night admission. Gave IOL information, orders and request given to RN team. Reviewed signs of active labor.  Reviewed warning signs in pregnancy. Patient to call with concerns PRN. Reviewed triage location.  30 min spent with patient greater than 80% spent in counseling and coordination of care.   Brenden Rudman Wilson SingerWren CNM

## 2013-06-11 NOTE — MAU Note (Signed)
PT  SAYS SHE  WAS IN OFFICE TODAY-  STRIPPED MEMBRANES- VE 1-2 CM.   DENIES HSV AND MRSA.

## 2013-06-11 NOTE — Progress Notes (Signed)
Pulse: 98 Patient denies any concerns.

## 2013-06-12 ENCOUNTER — Telehealth (HOSPITAL_COMMUNITY): Payer: Self-pay | Admitting: *Deleted

## 2013-06-12 ENCOUNTER — Inpatient Hospital Stay (HOSPITAL_COMMUNITY)
Admission: AD | Admit: 2013-06-12 | Discharge: 2013-06-16 | DRG: 765 | Disposition: A | Discharge status: Home / Self Care | Payer: Medicaid Other | Source: Ambulatory Visit | Attending: Obstetrics & Gynecology | Admitting: Obstetrics & Gynecology

## 2013-06-12 ENCOUNTER — Encounter: Payer: Self-pay | Admitting: Advanced Practice Midwife

## 2013-06-12 ENCOUNTER — Other Ambulatory Visit: Payer: Self-pay | Admitting: *Deleted

## 2013-06-12 ENCOUNTER — Encounter (HOSPITAL_COMMUNITY): Payer: Medicaid Other | Admitting: Anesthesiology

## 2013-06-12 ENCOUNTER — Encounter (HOSPITAL_COMMUNITY): Payer: Self-pay | Admitting: *Deleted

## 2013-06-12 ENCOUNTER — Ambulatory Visit (INDEPENDENT_AMBULATORY_CARE_PROVIDER_SITE_OTHER): Payer: Medicaid Other | Admitting: Obstetrics

## 2013-06-12 ENCOUNTER — Inpatient Hospital Stay (HOSPITAL_COMMUNITY)
Admission: AD | Admit: 2013-06-12 | Discharge: 2013-06-12 | Disposition: A | Discharge status: Home / Self Care | Payer: Medicaid Other | Source: Ambulatory Visit | Attending: Obstetrics & Gynecology | Admitting: Obstetrics & Gynecology

## 2013-06-12 ENCOUNTER — Inpatient Hospital Stay (HOSPITAL_COMMUNITY): Payer: Medicaid Other | Admitting: Anesthesiology

## 2013-06-12 ENCOUNTER — Encounter: Payer: Self-pay | Admitting: Obstetrics & Gynecology

## 2013-06-12 ENCOUNTER — Inpatient Hospital Stay (HOSPITAL_COMMUNITY): Payer: Medicaid Other

## 2013-06-12 VITALS — BP 121/82

## 2013-06-12 DIAGNOSIS — R52 Pain, unspecified: Secondary | ICD-10-CM

## 2013-06-12 DIAGNOSIS — O480 Post-term pregnancy: Secondary | ICD-10-CM

## 2013-06-12 DIAGNOSIS — O411090 Infection of amniotic sac and membranes, unspecified, unspecified trimester, not applicable or unspecified: Secondary | ICD-10-CM | POA: Diagnosis present

## 2013-06-12 DIAGNOSIS — Z3400 Encounter for supervision of normal first pregnancy, unspecified trimester: Secondary | ICD-10-CM

## 2013-06-12 LAB — CBC
HCT: 34.2 % — ABNORMAL LOW (ref 36.0–46.0)
Hemoglobin: 11.7 g/dL — ABNORMAL LOW (ref 12.0–15.0)
MCH: 30.2 pg (ref 26.0–34.0)
MCHC: 34.2 g/dL (ref 30.0–36.0)
MCV: 88.1 fL (ref 78.0–100.0)
Platelets: 295 10*3/uL (ref 150–400)
RBC: 3.88 MIL/uL (ref 3.87–5.11)
RDW: 12.4 % (ref 11.5–15.5)
WBC: 10.4 10*3/uL (ref 4.0–10.5)

## 2013-06-12 LAB — TYPE AND SCREEN
ABO/RH(D): O POS
Antibody Screen: NEGATIVE

## 2013-06-12 MED ORDER — DIPHENHYDRAMINE HCL 50 MG/ML IJ SOLN: 12.5000 mg | INTRAMUSCULAR | Status: DC | PRN

## 2013-06-12 MED ORDER — ONDANSETRON HCL 4 MG/2ML IJ SOLN: 4.0000 mg | Freq: Four times a day (QID) | INTRAMUSCULAR | Status: DC | PRN

## 2013-06-12 MED ORDER — OXYCODONE-ACETAMINOPHEN 5-325 MG PO TABS
2.0000 | ORAL_TABLET | Freq: Once | ORAL | Status: AC
  Administered 2013-06-12: 2 via ORAL
  Filled 2013-06-12: qty 2

## 2013-06-12 MED ORDER — OXYCODONE HCL 10 MG PO TABS: 10.0000 mg | ORAL_TABLET | Freq: Four times a day (QID) | ORAL | Status: DC | PRN

## 2013-06-12 MED ORDER — TERBUTALINE SULFATE 1 MG/ML IJ SOLN: 0.2500 mg | Freq: Once | INTRAMUSCULAR | Status: AC | PRN

## 2013-06-12 MED ORDER — IBUPROFEN 600 MG PO TABS: 600.0000 mg | ORAL_TABLET | Freq: Four times a day (QID) | ORAL | Status: DC | PRN

## 2013-06-12 MED ORDER — EPHEDRINE 5 MG/ML INJ
10.0000 mg | INTRAVENOUS | Status: DC | PRN
  Filled 2013-06-12: qty 2

## 2013-06-12 MED ORDER — LACTATED RINGERS IV SOLN: 500.0000 mL | INTRAVENOUS | Status: DC | PRN

## 2013-06-12 MED ORDER — OXYTOCIN 40 UNITS IN LACTATED RINGERS INFUSION - SIMPLE MED: 62.5000 mL/h | INTRAVENOUS | Status: DC

## 2013-06-12 MED ORDER — ZOLPIDEM TARTRATE 5 MG PO TABS: 5.0000 mg | ORAL_TABLET | Freq: Every evening | ORAL | Status: DC | PRN

## 2013-06-12 MED ORDER — FENTANYL 2.5 MCG/ML BUPIVACAINE 1/10 % EPIDURAL INFUSION (WH - ANES)
INTRAMUSCULAR | Status: DC | PRN
  Administered 2013-06-12: 14 mL/h via EPIDURAL

## 2013-06-12 MED ORDER — ACETAMINOPHEN 325 MG PO TABS
650.0000 mg | ORAL_TABLET | ORAL | Status: DC | PRN
  Administered 2013-06-13: 650 mg via ORAL
  Filled 2013-06-12: qty 2

## 2013-06-12 MED ORDER — MISOPROSTOL 25 MCG QUARTER TABLET: 25.0000 ug | ORAL_TABLET | ORAL | Status: DC | PRN

## 2013-06-12 MED ORDER — OXYCODONE-ACETAMINOPHEN 5-325 MG PO TABS: 1.0000 | ORAL_TABLET | ORAL | Status: DC | PRN

## 2013-06-12 MED ORDER — OXYTOCIN BOLUS FROM INFUSION: 500.0000 mL | INTRAVENOUS | Status: DC

## 2013-06-12 MED ORDER — PHENYLEPHRINE 40 MCG/ML (10ML) SYRINGE FOR IV PUSH (FOR BLOOD PRESSURE SUPPORT)
80.0000 ug | PREFILLED_SYRINGE | INTRAVENOUS | Status: DC | PRN
  Filled 2013-06-12: qty 2

## 2013-06-12 MED ORDER — LACTATED RINGERS IV SOLN
500.0000 mL | Freq: Once | INTRAVENOUS | Status: AC
  Administered 2013-06-12: 500 mL via INTRAVENOUS

## 2013-06-12 MED ORDER — FLEET ENEMA 7-19 GM/118ML RE ENEM: 1.0000 | ENEMA | RECTAL | Status: DC | PRN

## 2013-06-12 MED ORDER — CITRIC ACID-SODIUM CITRATE 334-500 MG/5ML PO SOLN
30.0000 mL | ORAL | Status: DC | PRN
  Administered 2013-06-13: 30 mL via ORAL
  Filled 2013-06-12: qty 15

## 2013-06-12 MED ORDER — FENTANYL 2.5 MCG/ML BUPIVACAINE 1/10 % EPIDURAL INFUSION (WH - ANES)
14.0000 mL/h | INTRAMUSCULAR | Status: DC | PRN
Start: 2013-06-12 — End: 2013-06-13
  Administered 2013-06-13: 14 mL/h via EPIDURAL
  Filled 2013-06-12 (×3): qty 125

## 2013-06-12 MED ORDER — LACTATED RINGERS IV SOLN
INTRAVENOUS | Status: DC
Start: 2013-06-12 — End: 2013-06-13
  Administered 2013-06-12 – 2013-06-13 (×2): via INTRAVENOUS

## 2013-06-12 MED ORDER — LIDOCAINE HCL (PF) 1 % IJ SOLN
INTRAMUSCULAR | Status: DC | PRN
  Administered 2013-06-12 (×2): 5 mL
  Administered 2013-06-12: 3 mL
  Administered 2013-06-13: 10 mL

## 2013-06-12 MED ORDER — PHENYLEPHRINE 40 MCG/ML (10ML) SYRINGE FOR IV PUSH (FOR BLOOD PRESSURE SUPPORT)
80.0000 ug | PREFILLED_SYRINGE | INTRAVENOUS | Status: DC | PRN
  Filled 2013-06-12: qty 10
  Filled 2013-06-12: qty 2
  Filled 2013-06-12: qty 10

## 2013-06-12 MED ORDER — LIDOCAINE HCL (PF) 1 % IJ SOLN
30.0000 mL | INTRAMUSCULAR | Status: DC | PRN
  Filled 2013-06-12: qty 30

## 2013-06-12 MED ORDER — EPHEDRINE 5 MG/ML INJ
10.0000 mg | INTRAVENOUS | Status: DC | PRN
  Filled 2013-06-12: qty 2
  Filled 2013-06-12 (×2): qty 4

## 2013-06-12 NOTE — Discharge Instructions (Signed)
Braxton Hicks Contractions Pregnancy is commonly associated with contractions of the uterus throughout the pregnancy. Towards the end of pregnancy (32 to 34 weeks), these contractions (Braxton Hicks) can develop more often and may become more forceful. This is not true labor because these contractions do not result in opening (dilatation) and thinning of the cervix. They are sometimes difficult to tell apart from true labor because these contractions can be forceful and people have different pain tolerances. You should not feel embarrassed if you go to the hospital with false labor. Sometimes, the only way to tell if you are in true labor is for your caregiver to follow the changes in the cervix. How to tell the difference between true and false labor:  False labor.  The contractions of false labor are usually shorter, irregular and not as hard as those of true labor.  They are often felt in the front of the lower abdomen and in the groin.  They may leave with walking around or changing positions while lying down.  They get weaker and are shorter lasting as time goes on.  These contractions are usually irregular.  They do not usually become progressively stronger, regular and closer together as with true labor.  True labor.  Contractions in true labor last 30 to 70 seconds, become very regular, usually become more intense, and increase in frequency.  They do not go away with walking.  The discomfort is usually felt in the top of the uterus and spreads to the lower abdomen and low back.  True labor can be determined by your caregiver with an exam. This will show that the cervix is dilating and getting thinner. If there are no prenatal problems or other health problems associated with the pregnancy, it is completely safe to be sent home with false labor and await the onset of true labor. HOME CARE INSTRUCTIONS   Keep up with your usual exercises and instructions.  Take medications as  directed.  Keep your regular prenatal appointment.  Eat and drink lightly if you think you are going into labor.  If BH contractions are making you uncomfortable:  Change your activity position from lying down or resting to walking/walking to resting.  Sit and rest in a tub of warm water.  Drink 2 to 3 glasses of water. Dehydration may cause B-H contractions.  Do slow and deep breathing several times an hour. SEEK IMMEDIATE MEDICAL CARE IF:   Your contractions continue to become stronger, more regular, and closer together.  You have a gushing, burst or leaking of fluid from the vagina.  An oral temperature above 102 F (38.9 C) develops.  You have passage of blood-tinged mucus.  You develop vaginal bleeding.  You develop continuous belly (abdominal) pain.  You have low back pain that you never had before.  You feel the baby's head pushing down causing pelvic pressure.  The baby is not moving as much as it used to. Document Released: 03/07/2005 Document Revised: 05/30/2011 Document Reviewed: 12/17/2012 [employer] Patient Information 2014 [employer].  Fetal Movement Counts Patient Name: __________________________________________________ Patient Due Date: ____________________ Performing a fetal movement count is highly recommended in high-risk pregnancies, but it is good for every pregnant woman to do. Your caregiver may ask you to start counting fetal movements at 28 weeks of the pregnancy. Fetal movements often increase:  After eating a full meal.  After physical activity.  After eating or drinking something sweet or cold.  At rest. Pay attention to when you feel   the baby is most active. This will help you notice a pattern of your baby's sleep and wake cycles and what factors contribute to an increase in fetal movement. It is important to perform a fetal movement count at the same time each day when your baby is normally most active.  HOW TO COUNT FETAL  MOVEMENTS 1. Find a quiet and comfortable area to sit or lie down on your left side. Lying on your left side provides the best blood and oxygen circulation to your baby. 2. Write down the day and time on a sheet of paper or in a journal. 3. Start counting kicks, flutters, swishes, rolls, or jabs in a 2 hour period. You should feel at least 10 movements within 2 hours. 4. If you do not feel 10 movements in 2 hours, wait 2 3 hours and count again. Look for a change in the pattern or not enough counts in 2 hours. SEEK MEDICAL CARE IF:  You feel less than 10 counts in 2 hours, tried twice.  There is no movement in over an hour.  The pattern is changing or taking longer each day to reach 10 counts in 2 hours.  You feel the baby is not moving as he or she usually does. Date: ____________ Movements: ____________ Start time: ____________ Finish time: ____________  Date: ____________ Movements: ____________ Start time: ____________ Finish time: ____________ Date: ____________ Movements: ____________ Start time: ____________ Finish time: ____________ Date: ____________ Movements: ____________ Start time: ____________ Finish time: ____________ Date: ____________ Movements: ____________ Start time: ____________ Finish time: ____________ Date: ____________ Movements: ____________ Start time: ____________ Finish time: ____________ Date: ____________ Movements: ____________ Start time: ____________ Finish time: ____________ Date: ____________ Movements: ____________ Start time: ____________ Finish time: ____________  Date: ____________ Movements: ____________ Start time: ____________ Finish time: ____________ Date: ____________ Movements: ____________ Start time: ____________ Finish time: ____________ Date: ____________ Movements: ____________ Start time: ____________ Finish time: ____________ Date: ____________ Movements: ____________ Start time: ____________ Finish time: ____________ Date: ____________  Movements: ____________ Start time: ____________ Finish time: ____________ Date: ____________ Movements: ____________ Start time: ____________ Finish time: ____________ Date: ____________ Movements: ____________ Start time: ____________ Finish time: ____________  Date: ____________ Movements: ____________ Start time: ____________ Finish time: ____________ Date: ____________ Movements: ____________ Start time: ____________ Finish time: ____________ Date: ____________ Movements: ____________ Start time: ____________ Finish time: ____________ Date: ____________ Movements: ____________ Start time: ____________ Finish time: ____________ Date: ____________ Movements: ____________ Start time: ____________ Finish time: ____________ Date: ____________ Movements: ____________ Start time: ____________ Finish time: ____________ Date: ____________ Movements: ____________ Start time: ____________ Finish time: ____________  Date: ____________ Movements: ____________ Start time: ____________ Finish time: ____________ Date: ____________ Movements: ____________ Start time: ____________ Finish time: ____________ Date: ____________ Movements: ____________ Start time: ____________ Finish time: ____________ Date: ____________ Movements: ____________ Start time: ____________ Finish time: ____________ Date: ____________ Movements: ____________ Start time: ____________ Finish time: ____________ Date: ____________ Movements: ____________ Start time: ____________ Finish time: ____________ Date: ____________ Movements: ____________ Start time: ____________ Finish time: ____________  Date: ____________ Movements: ____________ Start time: ____________ Finish time: ____________ Date: ____________ Movements: ____________ Start time: ____________ Finish time: ____________ Date: ____________ Movements: ____________ Start time: ____________ Finish time: ____________ Date: ____________ Movements: ____________ Start time:  ____________ Finish time: ____________ Date: ____________ Movements: ____________ Start time: ____________ Finish time: ____________ Date: ____________ Movements: ____________ Start time: ____________ Finish time: ____________ Date: ____________ Movements: ____________ Start time: ____________ Finish time: ____________  Date: ____________ Movements: ____________ Start time: ____________ Finish time: ____________ Date: ____________ Movements: ____________ Start   time: ____________ Finish time: ____________ Date: ____________ Movements: ____________ Start time: ____________ Finish time: ____________ Date: ____________ Movements: ____________ Start time: ____________ Finish time: ____________ Date: ____________ Movements: ____________ Start time: ____________ Finish time: ____________ Date: ____________ Movements: ____________ Start time: ____________ Finish time: ____________ Date: ____________ Movements: ____________ Start time: ____________ Finish time: ____________  Date: ____________ Movements: ____________ Start time: ____________ Finish time: ____________ Date: ____________ Movements: ____________ Start time: ____________ Finish time: ____________ Date: ____________ Movements: ____________ Start time: ____________ Finish time: ____________ Date: ____________ Movements: ____________ Start time: ____________ Finish time: ____________ Date: ____________ Movements: ____________ Start time: ____________ Finish time: ____________ Date: ____________ Movements: ____________ Start time: ____________ Finish time: ____________ Date: ____________ Movements: ____________ Start time: ____________ Finish time: ____________  Date: ____________ Movements: ____________ Start time: ____________ Finish time: ____________ Date: ____________ Movements: ____________ Start time: ____________ Finish time: ____________ Date: ____________ Movements: ____________ Start time: ____________ Finish time: ____________ Date:  ____________ Movements: ____________ Start time: ____________ Finish time: ____________ Date: ____________ Movements: ____________ Start time: ____________ Finish time: ____________ Date: ____________ Movements: ____________ Start time: ____________ Finish time: ____________ Document Released: 04/06/2006 Document Revised: 02/22/2012 Document Reviewed: 01/02/2012 [employer] Patient Information 2014 [employer].  

## 2013-06-12 NOTE — MAU Note (Signed)
Pt G1 at 40.3wks having contractions every .  Seen earlier today SVE 2cm.  Denies leaking or bleeding.

## 2013-06-12 NOTE — Anesthesia Procedure Notes (Signed)
Epidural Patient location during procedure: OB  Staffing Anesthesiologist: Phillips GroutARIGNAN, Raymont Andreoni Performed by: anesthesiologist   Preanesthetic Checklist Completed: patient identified, site marked, surgical consent, pre-op evaluation, timeout performed, IV checked, risks and benefits discussed and monitors and equipment checked  Epidural Patient position: sitting Prep: ChloraPrep Patient monitoring: heart rate, continuous pulse ox and blood pressure Approach: midline Location: L4-L5 Injection technique: LOR saline  Needle:  Needle type: Tuohy  Needle gauge: 17 G Needle length: 9 cm and 9 Needle insertion depth: 8 cm Catheter type: closed end flexible Catheter size: 20 Guage Catheter at skin depth: 13 cm Test dose: negative  Assessment Events: blood not aspirated, injection not painful, no injection resistance, negative IV test and no paresthesia  Additional Notes   Patient tolerated the insertion well without complications.

## 2013-06-12 NOTE — Telephone Encounter (Signed)
Preadmission screen  

## 2013-06-12 NOTE — Discharge Instructions (Signed)
Keep your scheduled appointment for prenatal care. Call the office or MD on call with further concerns. Drink 8-10 glasses of water per day. Eat lightly. Return to MAU as needed. Make sure the baby is moving well everyday.

## 2013-06-12 NOTE — Progress Notes (Signed)
NST Reactive. Labor instructions given. 

## 2013-06-12 NOTE — Anesthesia Preprocedure Evaluation (Signed)
Anesthesia Evaluation  Patient identified by MRN, date of birth, ID band Patient awake    Reviewed: Allergy & Precautions, H&P , NPO status , Patient's Chart, lab work & pertinent test results  History of Anesthesia Complications Negative for: history of anesthetic complications  Airway Mallampati: II TM Distance: >3 FB Neck ROM: full    Dental no notable dental hx. (+) Teeth Intact   Pulmonary neg pulmonary ROS,  breath sounds clear to auscultation  Pulmonary exam normal       Cardiovascular negative cardio ROS  Rhythm:regular Rate:Normal     Neuro/Psych negative neurological ROS  negative psych ROS   GI/Hepatic negative GI ROS, Neg liver ROS,   Endo/Other  negative endocrine ROSMorbid obesity  Renal/GU negative Renal ROS  negative genitourinary   Musculoskeletal   Abdominal Normal abdominal exam  (+)   Peds  Hematology negative hematology ROS (+)   Anesthesia Other Findings   Reproductive/Obstetrics (+) Pregnancy                           Anesthesia Physical Anesthesia Plan  ASA: III  Anesthesia Plan: Epidural   Post-op Pain Management:    Induction:   Airway Management Planned:   Additional Equipment:   Intra-op Plan:   Post-operative Plan:   Informed Consent: I have reviewed the patients History and Physical, chart, labs and discussed the procedure including the risks, benefits and alternatives for the proposed anesthesia with the patient or authorized representative who has indicated his/her understanding and acceptance.     Plan Discussed with:   Anesthesia Plan Comments:         Anesthesia Quick Evaluation  

## 2013-06-12 NOTE — MAU Note (Signed)
Pt states she was seen in MAU last night, uc's are now more intense, denies LOF.  States she has a small amount of bleeding.

## 2013-06-12 NOTE — Progress Notes (Signed)
Pt states that she was seen in MAU earlier today for UC.  Pt states that she is 2cm dilated and was sent home, not in labor.  Pt states that her ctx are more frequent and more intense. Pt states that she is having some blood tinged d/c.  Contractions palpate strong.

## 2013-06-13 ENCOUNTER — Encounter (HOSPITAL_COMMUNITY): Payer: Self-pay | Admitting: Anesthesiology

## 2013-06-13 ENCOUNTER — Encounter (HOSPITAL_COMMUNITY)
Admission: AD | Disposition: A | Discharge status: Home / Self Care | Payer: Self-pay | Source: Ambulatory Visit | Attending: Obstetrics & Gynecology

## 2013-06-13 ENCOUNTER — Ambulatory Visit (HOSPITAL_COMMUNITY)
Admission: RE | Admit: 2013-06-13 | Discharge: 2013-06-13 | Disposition: A | Discharge status: Home / Self Care | Payer: Medicaid Other | Source: Ambulatory Visit | Attending: Advanced Practice Midwife | Admitting: Advanced Practice Midwife

## 2013-06-13 LAB — ABO/RH: ABO/RH(D): O POS

## 2013-06-13 LAB — RPR: RPR: NONREACTIVE

## 2013-06-13 SURGERY — Surgical Case
Anesthesia: Epidural | Site: Abdomen

## 2013-06-13 SURGERY — Surgical Case
Anesthesia: Regional

## 2013-06-13 MED ORDER — SENNOSIDES-DOCUSATE SODIUM 8.6-50 MG PO TABS
2.0000 | ORAL_TABLET | ORAL | Status: DC
  Administered 2013-06-14 – 2013-06-16 (×3): 2 via ORAL
  Filled 2013-06-13 (×3): qty 2

## 2013-06-13 MED ORDER — FERROUS SULFATE 325 (65 FE) MG PO TABS
325.0000 mg | ORAL_TABLET | Freq: Two times a day (BID) | ORAL | Status: DC
Start: 2013-06-13 — End: 2013-06-16
  Administered 2013-06-14 – 2013-06-16 (×5): 325 mg via ORAL
  Filled 2013-06-13 (×5): qty 1

## 2013-06-13 MED ORDER — BUPIVACAINE HCL (PF) 0.25 % IJ SOLN
INTRAMUSCULAR | Status: DC | PRN
  Administered 2013-06-13: 8 mL via EPIDURAL

## 2013-06-13 MED ORDER — SIMETHICONE 80 MG PO CHEW: 80.0000 mg | CHEWABLE_TABLET | ORAL | Status: DC | PRN

## 2013-06-13 MED ORDER — DIPHENHYDRAMINE HCL 25 MG PO CAPS: 25.0000 mg | ORAL_CAPSULE | Freq: Four times a day (QID) | ORAL | Status: DC | PRN

## 2013-06-13 MED ORDER — IBUPROFEN 600 MG PO TABS
600.0000 mg | ORAL_TABLET | Freq: Four times a day (QID) | ORAL | Status: DC
  Administered 2013-06-14 – 2013-06-16 (×10): 600 mg via ORAL
  Filled 2013-06-13 (×10): qty 1

## 2013-06-13 MED ORDER — MIDAZOLAM HCL 2 MG/2ML IJ SOLN
INTRAMUSCULAR | Status: DC | PRN
  Administered 2013-06-13: 2 mg via INTRAVENOUS

## 2013-06-13 MED ORDER — KETOROLAC TROMETHAMINE 30 MG/ML IJ SOLN: 15.0000 mg | Freq: Once | INTRAMUSCULAR | Status: DC | PRN

## 2013-06-13 MED ORDER — LACTATED RINGERS IV SOLN
INTRAVENOUS | Status: DC | PRN
  Administered 2013-06-13: 13:00:00 via INTRAVENOUS

## 2013-06-13 MED ORDER — SODIUM BICARBONATE 8.4 % IV SOLN
INTRAVENOUS | Status: DC | PRN
  Administered 2013-06-13: 7 mL via EPIDURAL
  Administered 2013-06-13: 2 mL via EPIDURAL
  Administered 2013-06-13 (×2): 10 mL via EPIDURAL
  Administered 2013-06-13: 5 mL via EPIDURAL
  Administered 2013-06-13: 8 mL via EPIDURAL

## 2013-06-13 MED ORDER — FENTANYL CITRATE 0.05 MG/ML IJ SOLN
INTRAMUSCULAR | Status: DC | PRN
  Administered 2013-06-13: 100 ug via INTRAVENOUS

## 2013-06-13 MED ORDER — NALBUPHINE HCL 10 MG/ML IJ SOLN
5.0000 mg | INTRAMUSCULAR | Status: DC | PRN
  Administered 2013-06-13: 10 mg via INTRAVENOUS

## 2013-06-13 MED ORDER — ONDANSETRON HCL 4 MG/2ML IJ SOLN
INTRAMUSCULAR | Status: DC | PRN
  Administered 2013-06-13: 4 mg via INTRAVENOUS

## 2013-06-13 MED ORDER — MEPERIDINE HCL 25 MG/ML IJ SOLN: 6.2500 mg | INTRAMUSCULAR | Status: DC | PRN

## 2013-06-13 MED ORDER — HYDROMORPHONE HCL PF 1 MG/ML IJ SOLN: 0.2500 mg | INTRAMUSCULAR | Status: DC | PRN

## 2013-06-13 MED ORDER — MIDAZOLAM HCL 2 MG/2ML IJ SOLN
INTRAMUSCULAR | Status: AC
  Filled 2013-06-13: qty 2

## 2013-06-13 MED ORDER — PRENATAL MULTIVITAMIN CH
1.0000 | ORAL_TABLET | Freq: Every day | ORAL | Status: DC
  Administered 2013-06-14 – 2013-06-15 (×2): 1 via ORAL
  Filled 2013-06-13 (×2): qty 1

## 2013-06-13 MED ORDER — DIPHENHYDRAMINE HCL 25 MG PO CAPS
25.0000 mg | ORAL_CAPSULE | ORAL | Status: DC | PRN
  Filled 2013-06-13: qty 1

## 2013-06-13 MED ORDER — MEASLES, MUMPS & RUBELLA VAC ~~LOC~~ INJ
0.5000 mL | INJECTION | Freq: Once | SUBCUTANEOUS | Status: DC
  Filled 2013-06-13: qty 0.5

## 2013-06-13 MED ORDER — OXYTOCIN 40 UNITS IN LACTATED RINGERS INFUSION - SIMPLE MED: 62.5000 mL/h | INTRAVENOUS | Status: AC

## 2013-06-13 MED ORDER — OXYTOCIN 10 UNIT/ML IJ SOLN
INTRAMUSCULAR | Status: AC
  Filled 2013-06-13: qty 4

## 2013-06-13 MED ORDER — KETOROLAC TROMETHAMINE 30 MG/ML IJ SOLN
INTRAMUSCULAR | Status: AC
  Filled 2013-06-13: qty 1

## 2013-06-13 MED ORDER — SCOPOLAMINE 1 MG/3DAYS TD PT72
MEDICATED_PATCH | TRANSDERMAL | Status: AC
  Filled 2013-06-13: qty 1

## 2013-06-13 MED ORDER — SODIUM BICARBONATE 8.4 % IV SOLN
INTRAVENOUS | Status: DC | PRN
  Administered 2013-06-13: 20 mL via PERINEURAL

## 2013-06-13 MED ORDER — PROMETHAZINE HCL 25 MG/ML IJ SOLN: 6.2500 mg | INTRAMUSCULAR | Status: DC | PRN

## 2013-06-13 MED ORDER — ZOLPIDEM TARTRATE 5 MG PO TABS: 5.0000 mg | ORAL_TABLET | Freq: Every evening | ORAL | Status: DC | PRN

## 2013-06-13 MED ORDER — SIMETHICONE 80 MG PO CHEW
80.0000 mg | CHEWABLE_TABLET | ORAL | Status: DC
  Administered 2013-06-14 – 2013-06-16 (×3): 80 mg via ORAL
  Filled 2013-06-13 (×3): qty 1

## 2013-06-13 MED ORDER — CLINDAMYCIN PHOSPHATE 900 MG/50ML IV SOLN: 900.0000 mg | Freq: Three times a day (TID) | INTRAVENOUS | Status: DC

## 2013-06-13 MED ORDER — IBUPROFEN 600 MG PO TABS: 600.0000 mg | ORAL_TABLET | Freq: Four times a day (QID) | ORAL | Status: DC | PRN

## 2013-06-13 MED ORDER — ONDANSETRON HCL 4 MG/2ML IJ SOLN: 4.0000 mg | Freq: Three times a day (TID) | INTRAMUSCULAR | Status: DC | PRN

## 2013-06-13 MED ORDER — LACTATED RINGERS IV SOLN
INTRAVENOUS | Status: DC
  Administered 2013-06-13 – 2013-06-14 (×2): via INTRAVENOUS

## 2013-06-13 MED ORDER — MAGNESIUM HYDROXIDE 400 MG/5ML PO SUSP: 30.0000 mL | ORAL | Status: DC | PRN

## 2013-06-13 MED ORDER — OXYTOCIN 10 UNIT/ML IJ SOLN
40.0000 [IU] | INTRAVENOUS | Status: DC | PRN
  Administered 2013-06-13: 40 [IU] via INTRAVENOUS

## 2013-06-13 MED ORDER — LACTATED RINGERS IV SOLN
INTRAVENOUS | Status: DC | PRN
  Administered 2013-06-13: 14:00:00 via INTRAVENOUS

## 2013-06-13 MED ORDER — DIPHENHYDRAMINE HCL 50 MG/ML IJ SOLN: 25.0000 mg | INTRAMUSCULAR | Status: DC | PRN

## 2013-06-13 MED ORDER — OXYCODONE-ACETAMINOPHEN 5-325 MG PO TABS
1.0000 | ORAL_TABLET | ORAL | Status: DC | PRN
  Administered 2013-06-14 – 2013-06-15 (×3): 1 via ORAL
  Administered 2013-06-16: 2 via ORAL
  Filled 2013-06-13 (×3): qty 1
  Filled 2013-06-13: qty 2

## 2013-06-13 MED ORDER — METOCLOPRAMIDE HCL 5 MG/ML IJ SOLN
10.0000 mg | Freq: Three times a day (TID) | INTRAMUSCULAR | Status: DC | PRN
Start: 2013-06-13 — End: 2013-06-16

## 2013-06-13 MED ORDER — KETOROLAC TROMETHAMINE 30 MG/ML IJ SOLN
30.0000 mg | Freq: Four times a day (QID) | INTRAMUSCULAR | Status: AC | PRN
  Administered 2013-06-13: 30 mg via INTRAMUSCULAR

## 2013-06-13 MED ORDER — WITCH HAZEL-GLYCERIN EX PADS
1.0000 | MEDICATED_PAD | CUTANEOUS | Status: DC | PRN
Start: 2013-06-13 — End: 2013-06-16

## 2013-06-13 MED ORDER — SODIUM CHLORIDE 0.9 % IJ SOLN: 3.0000 mL | INTRAMUSCULAR | Status: DC | PRN

## 2013-06-13 MED ORDER — OXYTOCIN 40 UNITS IN LACTATED RINGERS INFUSION - SIMPLE MED
1.0000 m[IU]/min | INTRAVENOUS | Status: DC
Start: 2013-06-13 — End: 2013-06-13
  Administered 2013-06-13: 2 m[IU]/min via INTRAVENOUS
  Filled 2013-06-13: qty 1000

## 2013-06-13 MED ORDER — SCOPOLAMINE 1 MG/3DAYS TD PT72
1.0000 | MEDICATED_PATCH | Freq: Once | TRANSDERMAL | Status: DC
  Administered 2013-06-13: 1.5 mg via TRANSDERMAL

## 2013-06-13 MED ORDER — TERBUTALINE SULFATE 1 MG/ML IJ SOLN: 0.2500 mg | Freq: Once | INTRAMUSCULAR | Status: DC | PRN

## 2013-06-13 MED ORDER — FENTANYL CITRATE 0.05 MG/ML IJ SOLN
INTRAMUSCULAR | Status: AC
  Filled 2013-06-13: qty 2

## 2013-06-13 MED ORDER — MORPHINE SULFATE 0.5 MG/ML IJ SOLN
INTRAMUSCULAR | Status: AC
  Filled 2013-06-13: qty 10

## 2013-06-13 MED ORDER — NALOXONE HCL 0.4 MG/ML IJ SOLN: 0.4000 mg | INTRAMUSCULAR | Status: DC | PRN

## 2013-06-13 MED ORDER — TETANUS-DIPHTH-ACELL PERTUSSIS 5-2.5-18.5 LF-MCG/0.5 IM SUSP: 0.5000 mL | Freq: Once | INTRAMUSCULAR | Status: DC

## 2013-06-13 MED ORDER — KETOROLAC TROMETHAMINE 30 MG/ML IJ SOLN: 30.0000 mg | Freq: Four times a day (QID) | INTRAMUSCULAR | Status: AC | PRN

## 2013-06-13 MED ORDER — 0.9 % SODIUM CHLORIDE (POUR BTL) OPTIME
TOPICAL | Status: DC | PRN
  Administered 2013-06-13: 1000 mL

## 2013-06-13 MED ORDER — ONDANSETRON HCL 4 MG PO TABS: 4.0000 mg | ORAL_TABLET | ORAL | Status: DC | PRN

## 2013-06-13 MED ORDER — ONDANSETRON HCL 4 MG/2ML IJ SOLN: 4.0000 mg | INTRAMUSCULAR | Status: DC | PRN

## 2013-06-13 MED ORDER — DIBUCAINE 1 % RE OINT: 1.0000 "application " | TOPICAL_OINTMENT | RECTAL | Status: DC | PRN

## 2013-06-13 MED ORDER — LANOLIN HYDROUS EX OINT: 1.0000 "application " | TOPICAL_OINTMENT | CUTANEOUS | Status: DC | PRN

## 2013-06-13 MED ORDER — MORPHINE SULFATE (PF) 0.5 MG/ML IJ SOLN
INTRAMUSCULAR | Status: DC | PRN
  Administered 2013-06-13: 4 mg via EPIDURAL
  Administered 2013-06-13: 1 mg via INTRAVENOUS

## 2013-06-13 MED ORDER — BUPIVACAINE HCL (PF) 0.25 % IJ SOLN
INTRAMUSCULAR | Status: AC
Start: 2013-06-13 — End: 2013-06-13
  Filled 2013-06-13: qty 30

## 2013-06-13 MED ORDER — ONDANSETRON HCL 4 MG/2ML IJ SOLN
INTRAMUSCULAR | Status: AC
  Filled 2013-06-13: qty 2

## 2013-06-13 MED ORDER — NALBUPHINE HCL 10 MG/ML IJ SOLN
5.0000 mg | INTRAMUSCULAR | Status: DC | PRN
  Filled 2013-06-13: qty 1

## 2013-06-13 MED ORDER — NALOXONE HCL 1 MG/ML IJ SOLN: 1.0000 ug/kg/h | INTRAVENOUS | Status: DC | PRN

## 2013-06-13 MED ORDER — DIPHENHYDRAMINE HCL 50 MG/ML IJ SOLN
12.5000 mg | INTRAMUSCULAR | Status: DC | PRN
  Administered 2013-06-13: 12.5 mg via INTRAVENOUS
  Filled 2013-06-13: qty 1

## 2013-06-13 MED ORDER — GENTAMICIN SULFATE 40 MG/ML IJ SOLN
Freq: Three times a day (TID) | INTRAVENOUS | Status: DC
  Administered 2013-06-13: 07:00:00 via INTRAVENOUS
  Administered 2013-06-13: 110.75 mL via INTRAVENOUS
  Filled 2013-06-13 (×3): qty 4.75

## 2013-06-13 MED ORDER — CHLOROPROCAINE HCL 3 % IJ SOLN
INTRAMUSCULAR | Status: AC
  Filled 2013-06-13: qty 20

## 2013-06-13 MED ORDER — SODIUM BICARBONATE 8.4 % IV SOLN
INTRAVENOUS | Status: AC
  Filled 2013-06-13: qty 50

## 2013-06-13 SURGICAL SUPPLY — 41 items
CANISTER WOUND CARE 500ML ATS (WOUND CARE) IMPLANT
CLAMP CORD UMBIL (MISCELLANEOUS) IMPLANT
CLOTH BEACON ORANGE TIMEOUT ST (SAFETY) IMPLANT
CONTAINER PREFILL 10% NBF 15ML (MISCELLANEOUS) IMPLANT
DERMABOND ADVANCED (GAUZE/BANDAGES/DRESSINGS)
DERMABOND ADVANCED .7 DNX12 (GAUZE/BANDAGES/DRESSINGS) IMPLANT
DRAPE LG THREE QUARTER DISP (DRAPES) IMPLANT
DRSG OPSITE POSTOP 4X10 (GAUZE/BANDAGES/DRESSINGS) IMPLANT
DRSG VAC ATS LRG SENSATRAC (GAUZE/BANDAGES/DRESSINGS) IMPLANT
DRSG VAC ATS MED SENSATRAC (GAUZE/BANDAGES/DRESSINGS) IMPLANT
DRSG VAC ATS SM SENSATRAC (GAUZE/BANDAGES/DRESSINGS) IMPLANT
DURAPREP 26ML APPLICATOR (WOUND CARE) IMPLANT
ELECT REM PT RETURN 9FT ADLT (ELECTROSURGICAL)
ELECTRODE REM PT RTRN 9FT ADLT (ELECTROSURGICAL) IMPLANT
EXTRACTOR VACUUM M CUP 4 TUBE (SUCTIONS) IMPLANT
EXTRACTOR VACUUM M CUP 4' TUBE (SUCTIONS)
GLOVE BIO SURGEON STRL SZ8 (GLOVE) IMPLANT
GOWN STRL REUS W/TWL LRG LVL3 (GOWN DISPOSABLE) IMPLANT
KIT ABG SYR 3ML LUER SLIP (SYRINGE) IMPLANT
NEEDLE HYPO 25X5/8 SAFETYGLIDE (NEEDLE) IMPLANT
NS IRRIG 1000ML POUR BTL (IV SOLUTION) IMPLANT
PACK C SECTION WH (CUSTOM PROCEDURE TRAY) IMPLANT
PAD OB MATERNITY 4.3X12.25 (PERSONAL CARE ITEMS) IMPLANT
RTRCTR C-SECT PINK 25CM LRG (MISCELLANEOUS) IMPLANT
STAPLER VISISTAT 35W (STAPLE) IMPLANT
SUT GUT PLAIN 0 CT-3 TAN 27 (SUTURE) IMPLANT
SUT MNCRL 0 VIOLET CTX 36 (SUTURE) IMPLANT
SUT MNCRL AB 4-0 PS2 18 (SUTURE) IMPLANT
SUT MON AB 2-0 CT1 27 (SUTURE) IMPLANT
SUT MON AB 3-0 SH 27 (SUTURE)
SUT MON AB 3-0 SH27 (SUTURE) IMPLANT
SUT MONOCRYL 0 CTX 36 (SUTURE)
SUT PDS AB 0 CTX 60 (SUTURE) IMPLANT
SUT PLAIN 2 0 XLH (SUTURE) IMPLANT
SUT VIC AB 0 CTX 36 (SUTURE)
SUT VIC AB 0 CTX36XBRD ANBCTRL (SUTURE) IMPLANT
SUT VIC AB 2-0 CT1 27 (SUTURE)
SUT VIC AB 2-0 CT1 TAPERPNT 27 (SUTURE) IMPLANT
TOWEL OR 17X24 6PK STRL BLUE (TOWEL DISPOSABLE) IMPLANT
TRAY FOLEY CATH 14FR (SET/KITS/TRAYS/PACK) IMPLANT
WATER STERILE IRR 1000ML POUR (IV SOLUTION) IMPLANT

## 2013-06-13 SURGICAL SUPPLY — 51 items
BENZOIN TINCTURE PRP APPL 2/3 (GAUZE/BANDAGES/DRESSINGS) ×3 IMPLANT
CANISTER WOUND CARE 500ML ATS (WOUND CARE) IMPLANT
CLAMP CORD UMBIL (MISCELLANEOUS) IMPLANT
CLOSURE WOUND 1/2 X4 (GAUZE/BANDAGES/DRESSINGS) ×1
CLOTH BEACON ORANGE TIMEOUT ST (SAFETY) ×3 IMPLANT
CONTAINER PREFILL 10% NBF 15ML (MISCELLANEOUS) ×6 IMPLANT
DERMABOND ADVANCED (GAUZE/BANDAGES/DRESSINGS) ×2
DERMABOND ADVANCED .7 DNX12 (GAUZE/BANDAGES/DRESSINGS) ×1 IMPLANT
DRAPE LG THREE QUARTER DISP (DRAPES) IMPLANT
DRSG OPSITE POSTOP 4X10 (GAUZE/BANDAGES/DRESSINGS) ×3 IMPLANT
DRSG VAC ATS LRG SENSATRAC (GAUZE/BANDAGES/DRESSINGS) IMPLANT
DRSG VAC ATS MED SENSATRAC (GAUZE/BANDAGES/DRESSINGS) IMPLANT
DRSG VAC ATS SM SENSATRAC (GAUZE/BANDAGES/DRESSINGS) IMPLANT
DURAPREP 26ML APPLICATOR (WOUND CARE) ×3 IMPLANT
ELECT REM PT RETURN 9FT ADLT (ELECTROSURGICAL) ×3
ELECTRODE REM PT RTRN 9FT ADLT (ELECTROSURGICAL) ×1 IMPLANT
EXTRACTOR VACUUM M CUP 4 TUBE (SUCTIONS) IMPLANT
EXTRACTOR VACUUM M CUP 4' TUBE (SUCTIONS)
GAUZE SPONGE 4X4 12PLY STRL LF (GAUZE/BANDAGES/DRESSINGS) ×3 IMPLANT
GLOVE BIO SURGEON STRL SZ8 (GLOVE) IMPLANT
GLOVE BIOGEL PI IND STRL 7.0 (GLOVE) ×2 IMPLANT
GLOVE BIOGEL PI INDICATOR 7.0 (GLOVE) ×4
GLOVE SS BIOGEL STRL SZ 6.5 (GLOVE) ×1 IMPLANT
GLOVE SUPERSENSE BIOGEL SZ 6.5 (GLOVE) ×2
GOWN STRL REUS W/TWL LRG LVL3 (GOWN DISPOSABLE) ×6 IMPLANT
KIT ABG SYR 3ML LUER SLIP (SYRINGE) IMPLANT
NEEDLE HYPO 25X5/8 SAFETYGLIDE (NEEDLE) IMPLANT
NS IRRIG 1000ML POUR BTL (IV SOLUTION) ×3 IMPLANT
PACK C SECTION WH (CUSTOM PROCEDURE TRAY) ×3 IMPLANT
PAD ABD 7.5X8 STRL (GAUZE/BANDAGES/DRESSINGS) ×3 IMPLANT
PAD OB MATERNITY 4.3X12.25 (PERSONAL CARE ITEMS) ×3 IMPLANT
RTRCTR C-SECT PINK 25CM LRG (MISCELLANEOUS) ×3 IMPLANT
STAPLER VISISTAT 35W (STAPLE) IMPLANT
STRIP CLOSURE SKIN 1/2X4 (GAUZE/BANDAGES/DRESSINGS) ×2 IMPLANT
SUT GUT PLAIN 0 CT-3 TAN 27 (SUTURE) IMPLANT
SUT MNCRL 0 VIOLET CTX 36 (SUTURE) ×3 IMPLANT
SUT MNCRL AB 4-0 PS2 18 (SUTURE) IMPLANT
SUT MON AB 2-0 CT1 27 (SUTURE) ×3 IMPLANT
SUT MON AB 3-0 SH 27 (SUTURE)
SUT MON AB 3-0 SH27 (SUTURE) IMPLANT
SUT MONOCRYL 0 CTX 36 (SUTURE) ×6
SUT PDS AB 0 CTX 60 (SUTURE) IMPLANT
SUT PLAIN 2 0 XLH (SUTURE) IMPLANT
SUT VIC AB 0 CTX 36 (SUTURE)
SUT VIC AB 0 CTX36XBRD ANBCTRL (SUTURE) IMPLANT
SUT VIC AB 2-0 CT1 27 (SUTURE)
SUT VIC AB 2-0 CT1 TAPERPNT 27 (SUTURE) IMPLANT
TAPE CLOTH SURG 4X10 WHT LF (GAUZE/BANDAGES/DRESSINGS) ×3 IMPLANT
TOWEL OR 17X24 6PK STRL BLUE (TOWEL DISPOSABLE) ×3 IMPLANT
TRAY FOLEY CATH 14FR (SET/KITS/TRAYS/PACK) IMPLANT
WATER STERILE IRR 1000ML POUR (IV SOLUTION) ×3 IMPLANT

## 2013-06-13 NOTE — Anesthesia Postprocedure Evaluation (Signed)
Anesthesia Post Note  Patient: Annette Welch  Procedure(s) Performed: Procedure(s) (LRB): CESAREAN SECTION (N/A)  Anesthesia type: Epidural  Patient location: PACU  Post pain: Pain level controlled  Post assessment: Post-op Vital signs reviewed  Last Vitals:  Filed Vitals:   06/13/13 1515  BP: 128/67  Pulse:   Temp:   Resp:     Post vital signs: Reviewed  Level of consciousness: awake  Complications: No apparent anesthesia complications

## 2013-06-13 NOTE — H&P (Signed)
Annette Welch is a 23 y.o. female presenting for contractions. Maternal Medical History:  Reason for admission: Contractions and nausea.   Contractions: Onset was 2 days ago.   Frequency: regular.   Perceived severity is moderate.    Fetal activity: Perceived fetal activity is normal.    Prenatal complications: no prenatal complications Prenatal Complications - Diabetes: none.    OB History   Grav Para Term Preterm Abortions TAB SAB Ect Mult Living   1              Past Medical History  Diagnosis Date  . Medical history non-contributory    Past Surgical History  Procedure Laterality Date  . No past surgeries     Family History: family history includes Heart disease in her mother. Social History:  reports that she has never smoked. She has never used smokeless tobacco. She reports that she does not drink alcohol or use illicit drugs.     Review of Systems  Constitutional: Negative for fever.  Eyes: Negative for blurred vision.  Respiratory: Negative for shortness of breath.   Gastrointestinal: Positive for nausea. Negative for vomiting.  Skin: Negative for rash.  Neurological: Negative for headaches.    Dilation: 5 Effacement (%): 90 Station: 0;-1 Exam by:: SNix RN Blood pressure 136/100, pulse 123, temperature 99.3 F (37.4 C), temperature source Oral, resp. rate 18, height 5\' 4"  (1.626 m), weight 108.319 kg (238 lb 12.8 oz), last menstrual period 09/02/2012, SpO2 99.00%. Maternal Exam:  Uterine Assessment: Contraction frequency is regular.   Abdomen: not evaluated.  Introitus: not evaluated.   Cervix: Cervix evaluated by digital exam.     Fetal Exam Fetal Monitor Review: Baseline rate: 140.  Variability: moderate (6-25 bpm).   Pattern: accelerations present and late decelerations.    Fetal State Assessment: Category II - tracings are indeterminate.     Physical Exam  Constitutional: She appears well-developed.  HENT:  Head: Normocephalic.   Neck: Neck supple. No thyromegaly present.  Cardiovascular: Normal rate and regular rhythm.   Respiratory: Breath sounds normal.  GI: Soft. Bowel sounds are normal.  Skin: No rash noted.    Prenatal labs: ABO, Rh: --/--/O POS (03/25 2115) Antibody: NEG (03/25 2115) Rubella: 3.48 (08/18 1323) RPR: NON REACTIVE (03/25 2115)  HBsAg: NEGATIVE (08/18 1323)  HIV: NON REACTIVE (12/22 1406)  GBS: Negative (02/20 0000)   Assessment/Plan: Nullipara @ 7021w4d.  Active labor.  Category II FHT--accelerations/variability c/w fetal well being  Admit Monitor closely Anticipate an NSVD   JACKSON-MOORE,Kurtiss Wence A 06/13/2013, 4:30 AM

## 2013-06-13 NOTE — Progress Notes (Signed)
ANTIBIOTIC CONSULT NOTE - INITIAL  Pharmacy Consult for Gentamicin Indication: Maternal fever/Chlorioamnionitis  Allergies  Allergen Reactions  . Penicillins Hives    Patient Measurements: Height: 5\' 4"  (162.6 cm) Weight: 238 lb 12.8 oz (108.319 kg) IBW/kg (Calculated) : 54.7 Adjusted Body Weight: 71 kg  Vital Signs: Temp: 99.4 F (37.4 C) (03/26 0600) Temp src: Oral (03/26 0600) BP: 134/71 mmHg (03/26 0631) Pulse Rate: 116 (03/26 0631) Intake/Output from previous day:   Intake/Output from this shift:    Labs:  Recent Labs  06/12/13 2115  WBC 10.4  HGB 11.7*  PLT 295   CrCl is unknown because no creatinine reading has been taken. No results found for this basename: VANCOTROUGH, VANCOPEAK, VANCORANDOM, GENTTROUGH, GENTPEAK, GENTRANDOM, TOBRATROUGH, TOBRAPEAK, TOBRARND, AMIKACINPEAK, AMIKACINTROU, AMIKACIN,  in the last 72 hours   Microbiology: No results found for this or any previous visit (from the past 720 hour(s)).  Medical History: Past Medical History  Diagnosis Date  . Medical history non-contributory     Medications:  Clindamycin 900 mg IV every 8 hours  Assessment: 23 yo G1P0 at 40 weeks in active labor; now with increased temperature and presumed chorioamnionitis  Goal of Therapy:  Gentamicin peaks 6-8 mcg/ml; troughs <1 mcg/ml  Plan:  Gentamicin 190 mg IV every 8 hours Serum creatinine if gentamicin is continued> 3 days Serum gentamicin levels as indicated  Annette SneddonMason, Annette Welch Anne 06/13/2013,6:46 AM

## 2013-06-13 NOTE — Consult Note (Signed)
Neonatology Note:  Attendance at C-section:  I was asked by Dr. Jackson-Moore to attend this primary C/S at term due to FTP. The mother is a G1P0 O pos, GBS neg with an uncomplicated pregnancy. ROM 13 hours prior to delivery, fluid with thin meconium. The mother had a maximum temperature of 100.4 degrees during labor and received IV antibiotics. Infant vigorous with good spontaneous cry and tone. Needed only bulb suctioning. Ap 8/9. Lungs clear to ausc in DR. To CN to care of Pediatrician.  Jaeda Bruso C. Darianne Muralles, MD  

## 2013-06-13 NOTE — Transfer of Care (Signed)
Immediate Anesthesia Transfer of Care Note  Patient: Annette Welch  Procedure(s) Performed: Procedure(s): CESAREAN SECTION (N/A)  Patient Location: PACU  Anesthesia Type:Epidural  Level of Consciousness: awake  Airway & Oxygen Therapy: Patient Spontanous Breathing  Post-op Assessment: Report given to PACU RN and Post -op Vital signs reviewed and stable  Post vital signs: stable  Complications: No apparent anesthesia complications

## 2013-06-13 NOTE — Op Note (Signed)
Cesarean Section Procedure Note   Annette Welch   06/12/2013 - 06/13/2013  Indications: Arrest of dilatation, active labor, chorioamnionitis  Pre-operative Diagnosis:Arrest of dilatation, active labor, chorioamnionitis   Post-operative Diagnosis: Same   Surgeon: Antionette CharJACKSON-MOORE,Victory Strollo A  Assistants: none  Anesthesia: epidural  Procedure Details:  The patient was seen in the Holding Room. The risks, benefits, complications, treatment options, and expected outcomes were discussed with the patient. The patient concurred with the proposed plan, giving informed consent. The patient was identified as Annette Welch, Annette Welch and the procedure verified as C-Section Delivery. A Time Out was held and the above information confirmed.  After induction of anesthesia, the patient was draped and prepped in the usual sterile manner. A transverse incision was made and carried down through the subcutaneous tissue to the fascia. The fascial incision was made and extended transversely. The fascia was separated from the underlying rectus tissue superiorly. The peritoneum was identified and entered. The peritoneal incision was extended longitudinally. The utero-vesical peritoneal reflection was incised transversely and the bladder flap was bluntly freed from the lower uterine segment. A low transverse uterine incision was made. Delivered from cephalic presentation was a  living newborn female infant(s). APGAR (1 MIN): 8   APGAR (5 MINS): 9      A cord ph was not sent. The umbilical cord was clamped and cut cord. A sample was obtained for evaluation. The placenta was removed Intact and appeared normal.  The uterine incision was closed with running locked sutures of 1-0 Monocryl. A second imbricating layer of the same suture was placed.  Hemostasis was observed. The paracolic gutters were irrigated. The parieto peritoneum was closed in a running fashion with 2-0 Vicryl.  The fascia was then reapproximated with running sutures  of 0 Vicryl.  A running layer of 2-0 Vicryl was placed in the subcutaneous layer. The skin was closed with suture.  Instrument, sponge, and needle counts were correct prior the abdominal closure and were correct at the conclusion of the case.    Findings:  Meconium staining   Estimated Blood Loss: 800 ml  Total IV Fluids: per Anesthesiology  Urine Output: per Anesthesiology  Specimens: Placenta to pathology  Complications: no complications  Disposition: PACU - hemodynamically stable.  Maternal Condition: stable   Baby condition / location:  Couplet care / Skin to Skin    Signed: Surgeon(s): Antionette CharLisa Jackson-Moore, MD

## 2013-06-13 NOTE — Plan of Care (Signed)
Problem: Discharge Progression Outcomes Goal: Remove staples per MD order Outcome: Not Applicable Date Met:  67/70/34 Sutures under dressing

## 2013-06-13 NOTE — Progress Notes (Signed)
Annette Welch is a 23 y.o. G1P0 at 3567w4d by LMP admitted for active labor  Subjective:   Objective: BP 142/79  Pulse 122  Temp(Src) 100.1 F (37.8 C) (Axillary)  Resp 18  Ht 5\' 4"  (1.626 m)  Wt 238 lb 12.8 oz (108.319 kg)  BMI 40.97 kg/m2  SpO2 96%  LMP 09/02/2012   Total I/O In: -  Out: 850 [Urine:850]  FHT:  150-170 bpm UC:   regular, every 2-4 minutes SVE:   Dilation: 6 Effacement (%): 80 Station: -1 Exam by:: DR Clearance CootsHarper,  Labs: Lab Results  Component Value Date   WBC 10.4 06/12/2013   HGB 11.7* 06/12/2013   HCT 34.2* 06/12/2013   MCV 88.1 06/12/2013   PLT 295 06/12/2013    Assessment / Plan: Protracted active phase.  IUPC placed and Pitocin started.  Monitor closely.  Labor: Protracted active phase. Preeclampsia:  n/a Fetal Wellbeing:  Category I Pain Control:  Epidural I/D:  n/a Anticipated MOD:  NSVD  HARPER,CHARLES A 06/13/2013, 11:15 AM

## 2013-06-14 ENCOUNTER — Encounter (HOSPITAL_COMMUNITY): Payer: Self-pay | Admitting: Obstetrics & Gynecology

## 2013-06-14 ENCOUNTER — Encounter: Payer: Medicaid Other | Admitting: Advanced Practice Midwife

## 2013-06-14 LAB — CBC
HCT: 28.2 % — ABNORMAL LOW (ref 36.0–46.0)
Hemoglobin: 9.7 g/dL — ABNORMAL LOW (ref 12.0–15.0)
MCH: 30.9 pg (ref 26.0–34.0)
MCHC: 34.4 g/dL (ref 30.0–36.0)
MCV: 89.8 fL (ref 78.0–100.0)
PLATELETS: 225 10*3/uL (ref 150–400)
RBC: 3.14 MIL/uL — ABNORMAL LOW (ref 3.87–5.11)
RDW: 12.8 % (ref 11.5–15.5)
WBC: 15.4 10*3/uL — AB (ref 4.0–10.5)

## 2013-06-14 NOTE — Anesthesia Postprocedure Evaluation (Signed)
  Anesthesia Post-op Note  Patient: Annette Welch  Procedure(s) Performed: Procedure(s): CESAREAN SECTION (N/A)  Patient Location: PACU and Mother/Baby  Anesthesia Type:Spinal  Level of Consciousness: awake, alert  and oriented  Airway and Oxygen Therapy: Patient Spontanous Breathing  Post-op Pain: none  Post-op Assessment: Post-op Vital signs reviewed, Patient's Cardiovascular Status Stable, Respiratory Function Stable, No headache, No backache, No residual numbness and No residual motor weakness  Post-op Vital Signs: Reviewed and stable  Complications: No apparent anesthesia complications

## 2013-06-14 NOTE — Anesthesia Postprocedure Evaluation (Signed)
  Anesthesia Post-op Note  Patient: Annette Welch  Procedure(s) Performed: Procedure(s): CESAREAN SECTION (N/A)  Patient Location: Mother/Baby  Anesthesia Type:Epidural  Level of Consciousness: awake, alert  and oriented  Airway and Oxygen Therapy: Patient Spontanous Breathing  Post-op Pain: none  Post-op Assessment: Post-op Vital signs reviewed, Patient's Cardiovascular Status Stable, Respiratory Function Stable, No headache, No backache, No residual numbness and No residual motor weakness  Post-op Vital Signs: Reviewed and stable  Complications: No apparent anesthesia complications

## 2013-06-14 NOTE — Lactation Note (Signed)
This note was copied from the chart of Annette Welch. Lactation Consultation Note Mom sleeping at intervals. Baby in Nursery being watched d/t spitting old blood. Has BF well several times. Mom has small flat nipples. Rt. Compresses fairly well. DEBP set up, pump for 15 min. Mom took classes. Mom plans to BF for a couple of weeks then pump and bottle feed breast milk. Mom has large pendulum breast. Hand expression demonstrated w/noted colostrum expressed to end of nipple but not enough to give baby. Encourage to rub on nipples for soreness. Mom went to classes. WH/LC brochure given w/resources, support groups and LC services.Referred to Baby and Me Book in Breastfeeding section Pg. 22-23 for position options and Proper latch demonstration.Encouraged comfort during BF so colostrum flows better and mom will enjoy the feeding longer. Taking deep breaths and breast massage during BF. Encouraged to call for assistance if needed and to verify proper latch. Shells for inverted nipples given to wear in AM. When not sleeping, demonstrated to get bra. Patient Name: Annette Meryle ReadyChristalle Gorin FAOZH'YToday's Date: 06/14/2013 Reason for consult: Initial assessment   Maternal Data Has patient been taught Hand Expression?: Yes Does the patient have breastfeeding experience prior to this delivery?: No  Feeding Feeding Type: Breast Fed Length of feed: 10 min  LATCH Score/Interventions                      Lactation Tools Discussed/Used Tools: Shells;Pump;Flanges Flange Size: 27 Shell Type: Other (comment) (flat) Breast pump type: Double-Electric Breast Pump WIC Program: No Pump Review: Setup, frequency, and cleaning Initiated by:: Peri JeffersonL. Zoria Rawlinson RN Date initiated:: 06/14/13   Consult Status Consult Status: Follow-up Date: 06/14/13 Follow-up type: In-patient    Leilanee Righetti, Diamond NickelLAURA G 06/14/2013, 2:29 AM

## 2013-06-14 NOTE — Progress Notes (Signed)
UR completed 

## 2013-06-14 NOTE — Progress Notes (Signed)
Post Partum Day 1 Subjective: no complaints, up ad lib, voiding, tolerating PO, + flatus and Patient resting  Objective: Blood pressure 115/79, pulse 90, temperature 97.6 F (36.4 C), temperature source Oral, resp. rate 20, height 5\' 4"  (1.626 m), weight 238 lb 12.8 oz (108.319 kg), last menstrual period 09/02/2012, SpO2 97.00%, unknown if currently breastfeeding.  Physical Exam:  General: no distress Lochia: appropriate Uterine Fundus: firm Incision: healing well, no significant drainage, no dehiscence, no significant erythema DVT Evaluation: No evidence of DVT seen on physical exam.   Recent Labs  06/12/13 2115 06/14/13 0615  HGB 11.7* 9.7*  HCT 34.2* 28.2*    Assessment/Plan: Breastfeeding and Contraception Mic[place]onor PP   LOS: 2 days   Annette Welch 06/14/2013, 10:35 AM

## 2013-06-14 NOTE — Anesthesia Postprocedure Evaluation (Signed)
  Anesthesia Post-op Note  Patient: Annette Welch  Procedure(s) Performed: Procedure(s): CESAREAN SECTION (N/A)  Patient Location: Mother/Baby  Anesthesia Type:Epidural  Level of Consciousness: awake and alert   Airway and Oxygen Therapy: Patient Spontanous Breathing  Post-op Pain: mild  Post-op Assessment: Patient's Cardiovascular Status Stable, Respiratory Function Stable, No signs of Nausea or vomiting, Pain level controlled, No headache, No residual numbness and No residual motor weakness  Post-op Vital Signs: stable  Complications: No apparent anesthesia complications

## 2013-06-15 NOTE — Lactation Note (Addendum)
This note was copied from the chart of Annette Welch. Lactation Consultation Note Baby Annette 5846 hours old.  Mother states her nipples are extremely sore. Mother's nipples are flat and cracked. Baby Welch showing feeding cues.  Mother states it hurts too much to breastfeed. Introduced #20& #24 NS.  Assisted mother in placing baby in football hold on left breast.  Baby latched easily but mother could not tolerate pain. Assessed baby's suck and he has a good organized sucks, no biting.  Good tongue movement noted.   Attempted both sizes of NS on left breast.  Baby latched easily but mother cried in pain.  Unlatched baby. Good flow of breastmilk seen in shield. Brook RN gave mother pain medication. Reviewed how to use DEBP, mother has not been pumping.  Mother pumped both breasts for 15 min. Milk transitioning, 1 ml pumped. Spoon fed 1 ml of breastmilk to American Family InsuranceBaby Welch.  Baby still hungry. Parents requested a little amount of formula.  Reviewed LEAD and volume guidelines. Demonstrated how to syringe finger feed baby.  Baby took 12 ml Gerber formula. Helped mother put inverted breast shells in her nursing bra.   Encouraged mother to call for assistance with next feeding.    Patient Name: Annette Meryle ReadyChristalle Sypher ZOXWR'UToday's Date: 06/15/2013 Reason for consult: Follow-up assessment   Maternal Data    Feeding    LATCH Score/Interventions                      Lactation Tools Discussed/Used     Consult Status Consult Status: Follow-up Date: 06/16/13 Follow-up type: In-patient    Dahlia ByesBerkelhammer, [facility]os[facility] 06/15/2013, 12:13 PM

## 2013-06-15 NOTE — Progress Notes (Signed)
Subjective: Postpartum Day 2: Cesarean Delivery Patient reports tolerating PO, + flatus and no problems voiding.    Objective: Vital signs in last 24 hours: Temp:  [97.7 F (36.5 C)-98.4 F (36.9 C)] 98.4 F (36.9 C) (03/27 1725) Pulse Rate:  [72-80] 72 (03/27 1725) Resp:  [18-20] 20 (03/27 1725) BP: (99-134)/(58-84) 99/58 mmHg (03/27 1725) SpO2:  [97 %-100 %] 100 % (03/27 1345)  Physical Exam:  General: alert and no distress Lochia: appropriate Uterine Fundus: firm Incision: healing well DVT Evaluation: No evidence of DVT seen on physical exam.   Recent Labs  06/12/13 2115 06/14/13 0615  HGB 11.7* 9.7*  HCT 34.2* 28.2*    Assessment/Plan: Status post Cesarean section. Doing well postoperatively.  Continue current care.  Katherine Tout A 06/15/2013, 5:24 AM

## 2013-06-16 MED ORDER — OXYCODONE HCL 10 MG PO TABS: 10.0000 mg | ORAL_TABLET | Freq: Four times a day (QID) | ORAL | Status: AC | PRN

## 2013-06-16 MED ORDER — FUSION PLUS PO CAPS: 1.0000 | ORAL_CAPSULE | Freq: Every day | ORAL | Status: AC

## 2013-06-16 NOTE — Lactation Note (Signed)
This note was copied from the chart of Welch Welch. Lactation Consultation Note Follow up consult:  Welch Welch 8468 hours old and as planned mother wants to exclusively pump. Left nipple cracked bleeding, right nipple sore. Provided comfort gels and reviewed use and provided #30 flanges.  Mother states increased comfort. Reviewed supply and demand, breast massage and hand expression prior to pumping. Pumping and giving Annette breastmilk and formula in bottle.  Reviewed volume guidelines. [facility] and given rental pump. Will call WIC on Monday.  WIC pump information faxed. Encouraged mother to call if further assistance is needed. Patient Name: Welch Meryle ReadyChristalle Welch Welch Welch Reason for consult: Follow-up assessment   Maternal Data    Feeding    LATCH Score/Interventions                      Lactation Tools Discussed/Used Tools: Pump Flange Size: 30 Shell Type: Inverted Breast pump type: Double-Electric Breast Pump WIC Program: Yes Pump Review: Setup, frequency, and cleaning;Milk Storage   Consult Status Consult Status: Complete    Welch Welch, Ruth Boschen Welch, 10:28 AM

## 2013-06-16 NOTE — Progress Notes (Signed)
Subjective: Postpartum Day 3: Cesarean Delivery Patient reports tolerating PO, + flatus, + BM and no problems voiding.    Objective: Vital signs in last 24 hours: Temp:  [98.3 F (36.8 C)-98.7 F (37.1 C)] 98.3 F (36.8 C) (03/29 16100633) Pulse Rate:  [73-93] 73 (03/29 0633) Resp:  [20] 20 (03/29 0633) BP: (124-125)/(68-71) 124/68 mmHg (03/29 0633) SpO2:  [100 %] 100 % (03/29 [id number])  Physical Exam:  General: alert and no distress Lochia: appropriate Uterine Fundus: firm Incision: healing well DVT Evaluation: No evidence of DVT seen on physical exam.   Recent Labs  06/14/13 0615  HGB 9.7*  HCT 28.2*    Assessment/Plan: Status post Cesarean section. Doing well postoperatively.  Discharge home with standard precautions and return to clinic in 2 weeks.  Deshanae Lindo A 06/16/2013, 7:41 AM

## 2013-06-16 NOTE — Discharge Instructions (Signed)
Before Baby Comes Home °Ask any questions about feeding, diapering, and baby care before you leave the hospital. Ask again if you do not understand. Ask when you need to see the doctor again. °There are several things you must have before your baby comes home. °· Infant car seat. °· Crib. °· Do not let your baby sleep in a bed with you or anyone else. °· If you do not have a bed for your baby, ask the doctor what you can use that will be safe for the baby to sleep in. °Infant feeding supplies: °· 6 to 8 bottles (8 oz. size). °· 6 to 8 nipples. °· Measuring cup. °· Measuring tablespoon. °· Bottle brush. °· Sterilizer (or use any large pan or kettle with a lid). °· Formula that contains iron. °· A way to boil and cool water. °Breastfeeding supplies: °· Breast pump. °· Nipple cream. °Clothing: °· 24 to 36 cloth diapers and waterproof diaper covers or a box of disposable diapers. You may need as many as 10 to 12 diapers per day. °· 3 onesies (other clothing will depend on the time of year and the weather). °· 3 receiving blankets. °· 3 baby pajamas or gowns. °· 3 bibs. °Bath equipment: °· Mild soap. °· Petroleum jelly. No baby oil or powder. °· Soft cloth towel and wash cloth. °· Cotton balls. °· Separate bath basin for baby. Only sponge bathe until umbilical cord and circumcision are healed. °Other supplies: °· Thermometer and bulb syringe (ask the hospital to send them home with you). Ask your doctor about how you should take your baby's temperature. °· One to two pacifiers. °Prepare for an emergency: °· Know how to get to the hospital and know where to admit your baby. °· Put all doctor numbers near your house phone and in your cell phone if you have one. °Prepare your family: °· Talk with siblings about the baby coming home and how they feel about it. °· Decide how you want to handle visitors and other family members. °· Take offers for help with the baby. You will need time to adjust. °Know when to call the doctor.   °GET HELP RIGHT AWAY IF: °· Your baby's temperature is greater than 100.4° F (38° C). °· The softspot on your baby's head starts to bulge. °· Your baby is crying with no tears or has no wet diapers for 6 hours. °· Your baby has rapid breathing. °· Your baby is not as alert. °Document Released: 02/18/2008 Document Revised: 05/30/2011 Document Reviewed: 05/27/2010 °[employer]® Patient Information ©2014 [employer]. ° °

## 2013-06-16 NOTE — Discharge Summary (Signed)
Obstetric Discharge Summary Reason for Admission: onset of labor Prenatal Procedures: ultrasound Intrapartum Procedures: cesarean: low cervical, transverse Postpartum Procedures: none Complications-Operative and Postpartum: none Hemoglobin  Date Value Ref Range Status  06/14/2013 9.7* 12.0 - 15.0 g/dL Final     DELTA CHECK NOTED     REPEATED TO VERIFY     HCT  Date Value Ref Range Status  06/14/2013 28.2* 36.0 - 46.0 % Final    Physical Exam:  General: alert and no distress Lochia: appropriate Uterine Fundus: firm Incision: healing well DVT Evaluation: No evidence of DVT seen on physical exam.  Discharge Diagnoses: Term Pregnancy-delivered  Discharge Information: Date: 06/16/2013 Activity: pelvic rest Diet: routine Medications: PNV, Colace, Oxycodone, Iron Condition: stable Instructions: refer to practice specific booklet Discharge to: home Follow-up Information   Follow up with Antionette CharJACKSON-MOORE,LISA A, MD. Schedule an appointment as soon as possible for a visit in 2 weeks.   Specialty:  Obstetrics and Gynecology   Contact information:   [address] [place] [id number] [phone]       Newborn Data: Live born female  Birth Weight: 8 lb 10.5 oz (3926 g) APGAR: 8, 9  Home with mother.  HARPER,CHARLES A 06/16/2013, 7:45 AM

## 2013-06-17 ENCOUNTER — Inpatient Hospital Stay (HOSPITAL_COMMUNITY): Admission: RE | Admit: 2013-06-17 | Payer: Medicaid Other | Source: Ambulatory Visit

## 2013-06-17 ENCOUNTER — Encounter (HOSPITAL_COMMUNITY): Payer: Self-pay | Admitting: Obstetrics

## 2013-06-27 ENCOUNTER — Ambulatory Visit (INDEPENDENT_AMBULATORY_CARE_PROVIDER_SITE_OTHER): Payer: Medicaid Other | Admitting: Advanced Practice Midwife

## 2013-06-27 VITALS — BP 125/81 | HR 87 | Temp 98.2°F | Wt 209.0 lb

## 2013-06-27 DIAGNOSIS — Z9189 Other specified personal risk factors, not elsewhere classified: Secondary | ICD-10-CM

## 2013-06-27 DIAGNOSIS — IMO0001 Reserved for inherently not codable concepts without codable children: Secondary | ICD-10-CM

## 2013-06-27 MED ORDER — METOCLOPRAMIDE HCL 10 MG PO TABS: 10.0000 mg | ORAL_TABLET | Freq: Three times a day (TID) | ORAL | Status: DC

## 2013-06-27 MED ORDER — NORETHINDRONE 0.35 MG PO TABS: 1.0000 | ORAL_TABLET | Freq: Every day | ORAL | Status: AC

## 2013-06-27 NOTE — Progress Notes (Signed)
Subjective:     Annette Welch is a 23 y.o. female who presents for a postpartum visit. She is 2 weeks postpartum following a low cervical transverse Cesarean section. I have fully reviewed the prenatal and intrapartum course. The delivery was at 40.4 gestational weeks. Outcome: primary cesarean section, low transverse incision. Anesthesia: epidural and spinal. Postpartum course has been doing well.  Pt states that incision is healing well. Baby's course has been doing well. Baby is feeding by both breast and bottle - Gerber Gentle. Bleeding thin lochia. Bowel function is normal. Bladder function is normal. Patient is not sexually active. Contraception method is none.  Pt would like to discuss birth control, Mini Pill. Postpartum depression screening: negative.  Patient has not yet had IC. She would like to start birth control. Reports producing 1 oz of BM every 2-3 hours and supplementing w/ formula.   Denies PP depression and has good support at home.  The following portions of the patient's history were reviewed and updated as appropriate: allergies, current medications, past family history, past medical history, past social history, past surgical history and problem list.  Review of Systems A comprehensive review of systems was negative.   Objective:    BP 125/81  Pulse 87  Temp(Src) 98.2 F (36.8 C)  Wt 209 lb (94.802 kg)  LMP 09/02/2012  General:  alert and cooperative   Breasts:  inspection negative, no nipple discharge or bleeding, no masses or nodularity palpable        Abdomen: soft, non-tender; bowel sounds normal; no masses,  no organomegaly   Vulva:  normal  Vagina: normal vagina                    Incision: Clean, dry, intact, w/ out REED  Assessment:     2 postpartum exam. Pap smear not done at today's visit.  Breast pumping w/ low milk supply Supplementing w/ formula Normotensive/Afebrile  Plan:    1. Contraception: oral progesterone-only  contraceptive 2. Started on Reglan to help milk supply, d/c if increasing signs of PP depression. Day #1 QD, Day #2 BID, Day #3-9 TID, Day #10 BID, Day #11 QD 3. Follow up in: 4 weeks or as needed.   20 min spent with patient greater than 80% spent in counseling and coordination of care.   Eagan Shifflett Wilson SingerWren CNM

## 2013-07-14 NOTE — Progress Notes (Signed)
What does pt want refilled?

## 2013-07-26 ENCOUNTER — Ambulatory Visit (INDEPENDENT_AMBULATORY_CARE_PROVIDER_SITE_OTHER): Payer: Medicaid Other | Admitting: Advanced Practice Midwife

## 2013-07-26 ENCOUNTER — Encounter: Payer: Self-pay | Admitting: Advanced Practice Midwife

## 2013-07-26 VITALS — BP 105/67 | HR 73 | Temp 98.6°F | Ht 64.0 in | Wt 210.0 lb

## 2013-07-26 DIAGNOSIS — IMO0001 Reserved for inherently not codable concepts without codable children: Secondary | ICD-10-CM

## 2013-07-26 DIAGNOSIS — Z309 Encounter for contraceptive management, unspecified: Secondary | ICD-10-CM

## 2013-07-26 MED ORDER — NORGESTIMATE-ETH ESTRADIOL 0.25-35 MG-MCG PO TABS
1.0000 | ORAL_TABLET | Freq: Every day | ORAL | Status: AC
Start: 2013-07-26 — End: ?

## 2013-07-26 NOTE — Progress Notes (Signed)
Subjective:     Annette Welch is a 23 y.o. female who presents for a postpartum visit. She is 6 weeks postpartum following a spontaneous vaginal delivery. I have fully reviewed the prenatal and intrapartum course. The delivery was at term gestational weeks. Outcome: primary cesarean section, low transverse incision. Anesthesia: epidural. Postpartum course has been WNL. Baby's course has been stable. Baby is feeding by bottle. Bleeding having period currently. Bowel function is normal. Bladder function is normal. Patient is not sexually active. Contraception method is abstinence and OCP (estrogen/progesterone). Postpartum depression screening: negative.  The following portions of the patient's history were reviewed and updated as appropriate: allergies, current medications, past family history, past medical history, past social history, past surgical history and problem list.  Milk supply did not increase despite Reglan medication.  Patient would like to switch to OCP. No longer breast feeding. Patient denies personal or family hx of blood clot, liver disease. Does not smoke.  Patient c/o back pain. Desires oxycodone Rx for this.  Patient reports she had a pap that was normal approx 1 year ago.   Review of Systems A comprehensive review of systems was negative.   Objective:    BP 105/67  Pulse 73  Temp(Src) 98.6 F (37 C)  Ht 5\' 4"  (1.626 m)  Wt 210 lb (95.255 kg)  BMI 36.03 kg/m2  LMP 07/22/2013  Breastfeeding? No  General:  alert and cooperative   Breasts:  inspection negative, no nipple discharge or bleeding, no masses or nodularity palpable        Abdomen: soft, non-tender; bowel sounds normal; no masses,  no organomegaly   Vulva:  normal  Vagina: normal vagina                      Assessment:     6 postpartum exam. Pap smear not done at today's visit.  Back Pain Bottle feeding Candidate for OCP  Plan:    1. Contraception: OCP (estrogen/progesterone) 2.  Encouraged yoga, pilates, warm baths, warm compress and massage. Offered PT referral for back pain, patient declined. 3. Follow up in: 1 year or as needed.  Plan pap smear in 1-2 years.   Annette Welch CNM

## 2013-12-24 ENCOUNTER — Emergency Department (INDEPENDENT_AMBULATORY_CARE_PROVIDER_SITE_OTHER): Payer: Medicaid Other

## 2013-12-24 ENCOUNTER — Emergency Department (INDEPENDENT_AMBULATORY_CARE_PROVIDER_SITE_OTHER)
Admission: EM | Admit: 2013-12-24 | Discharge: 2013-12-24 | Disposition: A | Discharge status: Home / Self Care | Payer: Medicaid Other | Source: Home / Self Care | Attending: Family Medicine | Admitting: Family Medicine

## 2013-12-24 ENCOUNTER — Encounter (HOSPITAL_COMMUNITY): Payer: Self-pay | Admitting: Emergency Medicine

## 2013-12-24 DIAGNOSIS — S93601A Unspecified sprain of right foot, initial encounter: Secondary | ICD-10-CM

## 2013-12-24 MED ORDER — IBUPROFEN 800 MG PO TABS
ORAL_TABLET | ORAL | Status: AC
  Filled 2013-12-24: qty 1

## 2013-12-24 MED ORDER — IBUPROFEN 800 MG PO TABS
800.0000 mg | ORAL_TABLET | Freq: Once | ORAL | Status: AC
  Administered 2013-12-24: 800 mg via ORAL

## 2013-12-24 NOTE — Discharge Instructions (Signed)
Xrays were normal. No fracture or dislocation. Wear Ace wrap and post op shoe as needed during the day for comfort. Ice, elevation and ibuprofen to reduce pain and swelling. Activity as tolerated and follow up with orthopedist listed on your discharge paperwork if no improvement over the next 1-2 weeks.  Foot Sprain The muscles and cord like structures which attach muscle to bone (tendons) that surround the feet are made up of units. A foot sprain can occur at the weakest spot in any of these units. This condition is most often caused by injury to or overuse of the foot, as from playing contact sports, or aggravating a previous injury, or from poor conditioning, or obesity. SYMPTOMS  Pain with movement of the foot.  Tenderness and swelling at the injury site.  Loss of strength is present in moderate or severe sprains. THE THREE GRADES OR SEVERITY OF FOOT SPRAIN ARE:  Mild (Grade I): Slightly pulled muscle without tearing of muscle or tendon fibers or loss of strength.  Moderate (Grade II): Tearing of fibers in a muscle, tendon, or at the attachment to bone, with small decrease in strength.  Severe (Grade III): Rupture of the muscle-tendon-bone attachment, with separation of fibers. Severe sprain requires surgical repair. Often repeating (chronic) sprains are caused by overuse. Sudden (acute) sprains are caused by direct injury or over-use. DIAGNOSIS  Diagnosis of this condition is usually by your own observation. If problems continue, a caregiver may be required for further evaluation and treatment. X-rays may be required to make sure there are not breaks in the bones (fractures) present. Continued problems may require physical therapy for treatment. PREVENTION  Use strength and conditioning exercises appropriate for your sport.  Warm up properly prior to working out.  Use athletic shoes that are made for the sport you are participating in.  Allow adequate time for healing. Early return to  activities makes repeat injury more likely, and can lead to an unstable arthritic foot that can result in prolonged disability. Mild sprains generally heal in 3 to 10 days, with moderate and severe sprains taking 2 to 10 weeks. Your caregiver can help you determine the proper time required for healing. HOME CARE INSTRUCTIONS   Apply ice to the injury for 15-20 minutes, 03-04 times per day. Put the ice in a plastic bag and place a towel between the bag of ice and your skin.  An elastic wrap (like an Ace bandage) may be used to keep swelling down.  Keep foot above the level of the heart, or at least raised on a footstool, when swelling and pain are present.  Try to avoid use other than gentle range of motion while the foot is painful. Do not resume use until instructed by your caregiver. Then begin use gradually, not increasing use to the point of pain. If pain does develop, decrease use and continue the above measures, gradually increasing activities that do not cause discomfort, until you gradually achieve normal use.  Use crutches if and as instructed, and for the length of time instructed.  Keep injured foot and ankle wrapped between treatments.  Massage foot and ankle for comfort and to keep swelling down. Massage from the toes up towards the knee.  Only take over-the-counter or prescription medicines for pain, discomfort, or fever as directed by your caregiver. SEEK IMMEDIATE MEDICAL CARE IF:   Your pain and swelling increase, or pain is not controlled with medications.  You have loss of feeling in your foot or your foot  turns cold or blue.  You develop new, unexplained symptoms, or an increase of the symptoms that brought you to your caregiver. MAKE SURE YOU:   Understand these instructions.  Will watch your condition.  Will get help right away if you are not doing well or get worse. Document Released: 08/27/2001 Document Revised: 05/30/2011 Document Reviewed:  10/25/2007 [facility] Patient Information 2015 [place], [place]. This information is not intended to replace advice given to you by your health care provider. Make sure you discuss any questions you have with your health care provider.

## 2013-12-24 NOTE — ED Notes (Signed)
Pt  States  She  Twisted  Her  r  fot today when she  Turned  It  After  Stepping on a  Pine  Cone  She  Has  Pain /  Swelling to the  Affected  Foot

## 2013-12-24 NOTE — ED Provider Notes (Signed)
CSN: [id number]     Arrival date & time 12/24/13  1344 History   First MD Initiated Contact with Patient 12/24/13 1431     Chief Complaint  Patient presents with  . Foot Injury   (Consider location/radiation/quality/duration/timing/severity/associated sxs/prior Treatment) HPI Comments: PCP: none [place] [employer] Reports herself to be in otherwise good health.   Patient is a 23 y.o. female presenting with foot injury. The history is provided by the patient.  Foot Injury Location:  Foot Time since incident:  6 hours Injury: yes   Mechanism of injury: fall   Mechanism of injury comment:  Patient was walking outside early this morning while it was still dark and accidentally stepped on a pine cone. Twisted her right foot. Chronicity:  New Dislocation: no   Prior injury to area:  No Worsened by:  Bearing weight Associated symptoms: swelling     Past Medical History  Diagnosis Date  . Medical history non-contributory    Past Surgical History  Procedure Laterality Date  . No past surgeries    . Cesarean section N/A 06/13/2013    Procedure: CESAREAN SECTION;  Surgeon: Antionette CharLisa Jackson-Moore, MD;  Location: [facility];  Service: Obstetrics;  Laterality: N/A;  . Cesarean section N/A 06/13/2013    Procedure: CESAREAN SECTION;  Surgeon: Brock Badharles A Harper, MD;  Location: [facility];  Service: Obstetrics;  Laterality: N/A;   Family History  Problem Relation Age of Onset  . Heart disease Mother    History  Substance Use Topics  . Smoking status: Never Smoker   . Smokeless tobacco: Never Used  . Alcohol Use: No   OB History   Grav Para Term Preterm Abortions TAB SAB Ect Mult Living   1 1 1       1      Review of Systems  All other systems reviewed and are negative.   Allergies  Penicillins  Home Medications   Prior to Admission medications   Medication Sig Start Date End Date Taking? Authorizing Provider  Cholecalciferol (D3-1000) 1000 UNITS capsule Take 1,000 Units by  mouth daily.    Historical Provider, MD  Iron-FA-B Cmp-C-Biot-Probiotic (FUSION PLUS) CAPS Take 1 capsule by mouth daily before breakfast. 06/16/13   Brock Badharles A Harper, MD  norethindrone (MICRONOR,CAMILA,ERRIN) 0.35 MG tablet Take 1 tablet (0.35 mg total) by mouth daily. 06/27/13   Amy Dessa PhiHowell Wren, CNM  norgestimate-ethinyl estradiol (ORTHO-CYCLEN,SPRINTEC,PREVIFEM) 0.25-35 MG-MCG tablet Take 1 tablet by mouth daily. 07/26/13   Amy Dessa PhiHowell Wren, CNM  Oxycodone HCl 10 MG TABS Take 1 tablet (10 mg total) by mouth every 6 (six) hours as needed. 06/16/13   Brock Badharles A Harper, MD  Prenatal Vit-Fe Fumarate-FA (PRENATAL MULTIVITAMIN) TABS tablet Take 1 tablet by mouth daily at 12 noon.    Historical Provider, MD   BP 122/51  Pulse 77  Temp(Src) 98.2 F (36.8 C) (Oral)  Resp 20  Ht 5\' 5"  (1.651 m)  Wt 215 lb (97.523 kg)  BMI 35.78 kg/m2  SpO2 100%  LMP 12/14/2013 Physical Exam  Nursing note and vitals reviewed. Constitutional: She is oriented to person, place, and time. She appears well-developed and well-nourished. No distress.  HENT:  Head: Normocephalic and atraumatic.  Cardiovascular: Normal rate.   Pulmonary/Chest: Effort normal.  Musculoskeletal:       Right foot: She exhibits tenderness and swelling. She exhibits normal range of motion, normal capillary refill, no crepitus, no deformity and no laceration.       Feet:  Neurological: She is alert  and oriented to person, place, and time.  Skin: Skin is warm and dry. No rash noted. No erythema.  Psychiatric: She has a normal mood and affect. Her behavior is normal.    ED Course  Procedures (including critical care time) Labs Review Labs Reviewed - No data to display  Imaging Review Dg Foot Complete Right  12/24/2013   CLINICAL DATA:  Twisted right foot while walking today. Initial evaluation.  EXAM: RIGHT FOOT COMPLETE - 3+ VIEW  COMPARISON:  None.  FINDINGS: There is no evidence of fracture or dislocation. There is no evidence of  arthropathy or other focal bone abnormality. Soft tissues are unremarkable.  IMPRESSION: Negative.   Electronically Signed   By: Maisie Fus  Register   On: 12/24/2013 15:15     MDM   1. Sprain of right foot, initial encounter    Xrays were normal. No fracture or dislocation. Wear Ace wrap and post op shoe as needed during the day for comfort. Ice, elevation and ibuprofen to reduce pain and swelling. Activity as tolerated and follow up with orthopedist listed on your discharge paperwork if no improvement over the next 1-2 weeks.   Ria Clock, [place] 12/24/13 1538

## 2013-12-26 NOTE — ED Provider Notes (Signed)
Medical screening examination/treatment/procedure(s) were performed by resident physician or non-physician practitioner and as supervising physician I was immediately available for consultation/collaboration.   Harvey Matlack DOUGLAS MD.   Chloie Loney D Cicilia Clinger, MD 12/26/13 1740 

## 2014-01-20 ENCOUNTER — Encounter (HOSPITAL_COMMUNITY): Payer: Self-pay | Admitting: Emergency Medicine

## 2014-03-17 ENCOUNTER — Encounter: Payer: Self-pay | Admitting: *Deleted

## 2014-03-18 ENCOUNTER — Encounter: Payer: Self-pay | Admitting: Obstetrics & Gynecology

## 2014-04-16 NOTE — Progress Notes (Signed)
unknown

## 2014-07-29 ENCOUNTER — Ambulatory Visit: Payer: Medicaid Other | Admitting: Advanced Practice Midwife

## 2014-07-30 ENCOUNTER — Ambulatory Visit: Payer: Medicaid Other | Admitting: Obstetrics & Gynecology

## 2016-05-07 IMAGING — CR DG FOOT COMPLETE 3+V*R*
3 series · 3 of 3 positions shown · non-contrast
Comparison: None.

CLINICAL DATA: Twisted right foot while walking today. Initial
evaluation.

EXAM:
RIGHT FOOT COMPLETE - 3+ VIEW

[foot ap]
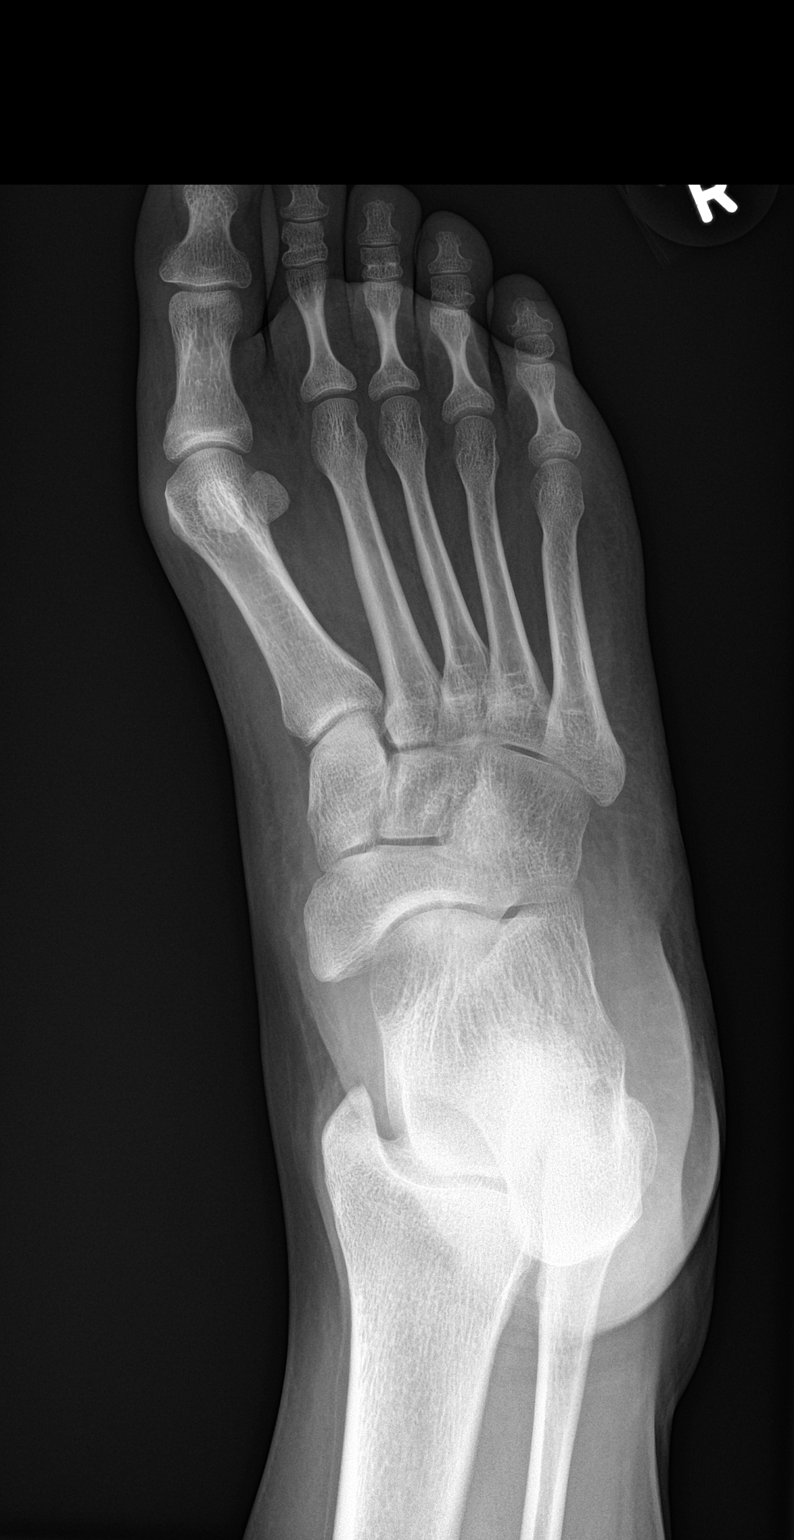

[foot obl]
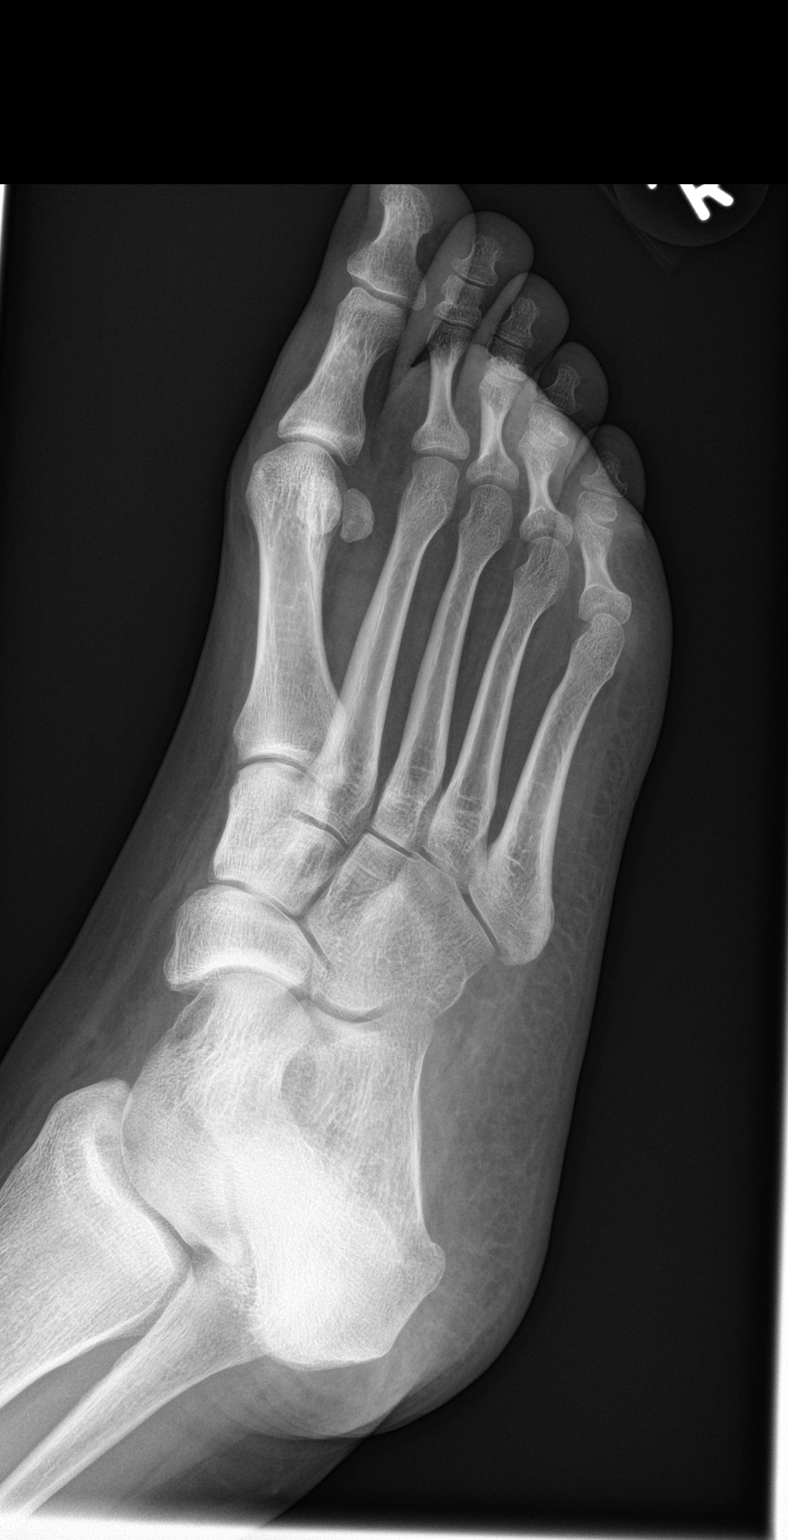

[foot lat]
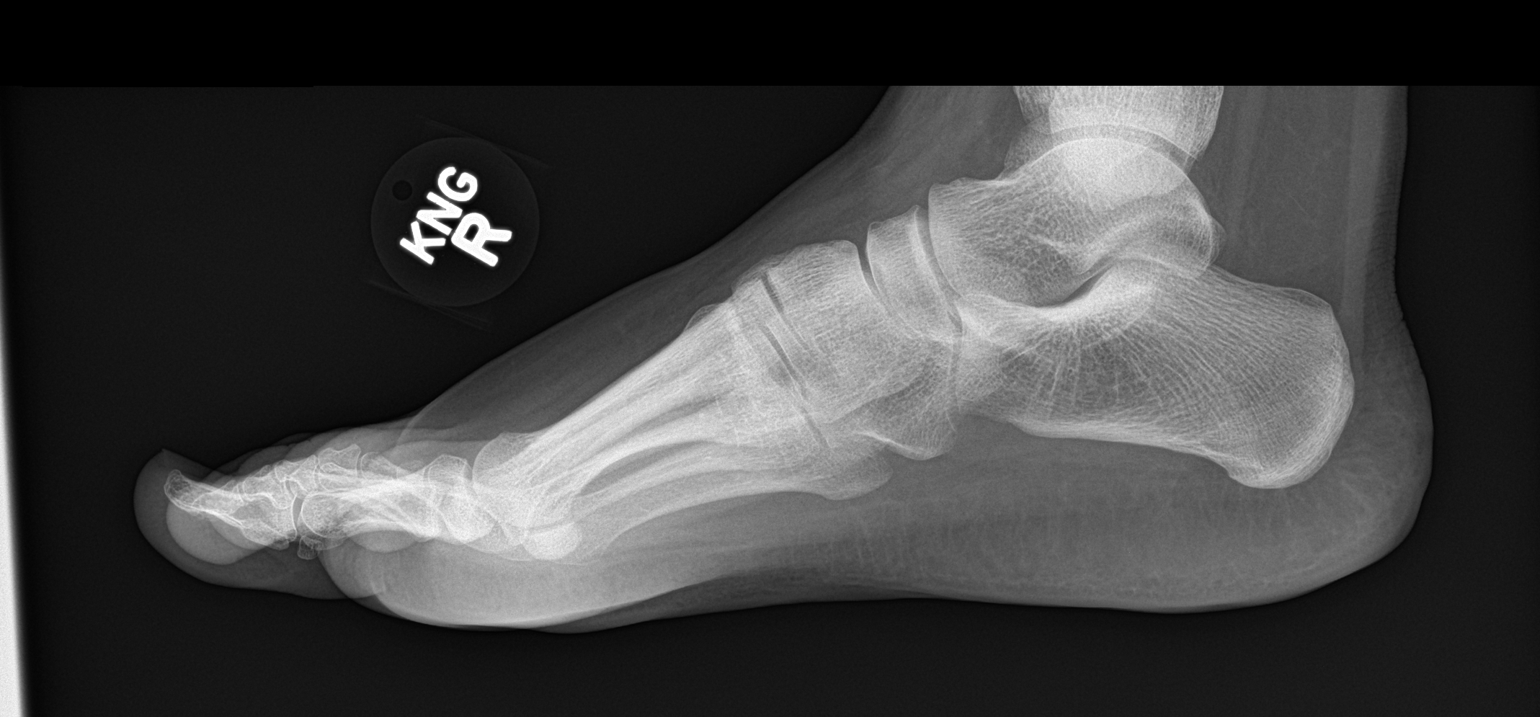

[3 of 3 positions shown; findings below may reference images not displayed]

FINDINGS: There is no evidence of fracture or dislocation. There is no
evidence of arthropathy or other focal bone abnormality. Soft
tissues are unremarkable.
IMPRESSION: Negative.

## 2016-05-24 ENCOUNTER — Other Ambulatory Visit: Payer: Self-pay

## 2016-05-24 NOTE — Telephone Encounter (Signed)
Error
